# Patient Record
Sex: Female | Born: 1965 | Race: White | Hispanic: No | Marital: Married | Smoking: Never smoker
Health system: Southern US, Community
[De-identification: ages and names within clinical notes are randomized; demographics above are authoritative.]

## PROBLEM LIST (undated history)

## (undated) DIAGNOSIS — C801 Malignant (primary) neoplasm, unspecified: Secondary | ICD-10-CM

## (undated) DIAGNOSIS — Z87442 Personal history of urinary calculi: Secondary | ICD-10-CM

## (undated) DIAGNOSIS — E785 Hyperlipidemia, unspecified: Secondary | ICD-10-CM

## (undated) DIAGNOSIS — E119 Type 2 diabetes mellitus without complications: Secondary | ICD-10-CM

## (undated) DIAGNOSIS — N200 Calculus of kidney: Secondary | ICD-10-CM

## (undated) HISTORY — PX: LAPAROSCOPIC ENDOMETRIOSIS FULGURATION: SUR769

## (undated) HISTORY — PX: ABDOMINAL HYSTERECTOMY: SHX81

---

## 2014-08-15 ENCOUNTER — Ambulatory Visit: Payer: Self-pay | Admitting: Internal Medicine

## 2015-01-24 ENCOUNTER — Other Ambulatory Visit: Payer: Self-pay | Admitting: Orthopedic Surgery

## 2015-01-24 DIAGNOSIS — M2342 Loose body in knee, left knee: Secondary | ICD-10-CM

## 2015-01-24 DIAGNOSIS — M2341 Loose body in knee, right knee: Secondary | ICD-10-CM

## 2015-01-31 ENCOUNTER — Ambulatory Visit
Admission: RE | Admit: 2015-01-31 | Discharge: 2015-01-31 | Disposition: A | Discharge status: Home / Self Care | Payer: Managed Care, Other (non HMO) | Source: Ambulatory Visit | Attending: Orthopedic Surgery | Admitting: Orthopedic Surgery

## 2015-01-31 DIAGNOSIS — M7122 Synovial cyst of popliteal space [Baker], left knee: Secondary | ICD-10-CM | POA: Insufficient documentation

## 2015-01-31 DIAGNOSIS — M2342 Loose body in knee, left knee: Secondary | ICD-10-CM | POA: Insufficient documentation

## 2015-01-31 DIAGNOSIS — M2242 Chondromalacia patellae, left knee: Secondary | ICD-10-CM | POA: Insufficient documentation

## 2015-01-31 DIAGNOSIS — M2341 Loose body in knee, right knee: Secondary | ICD-10-CM | POA: Diagnosis present

## 2015-01-31 DIAGNOSIS — M67461 Ganglion, right knee: Secondary | ICD-10-CM | POA: Diagnosis not present

## 2015-01-31 DIAGNOSIS — M179 Osteoarthritis of knee, unspecified: Secondary | ICD-10-CM | POA: Insufficient documentation

## 2017-09-07 ENCOUNTER — Encounter: Payer: Self-pay | Admitting: Emergency Medicine

## 2017-09-07 ENCOUNTER — Emergency Department: Payer: Commercial Managed Care - PPO

## 2017-09-07 ENCOUNTER — Inpatient Hospital Stay
Admission: EM | Admit: 2017-09-07 | Discharge: 2017-09-08 | DRG: 694 | Disposition: A | Discharge status: Home / Self Care | Payer: Commercial Managed Care - PPO | Attending: Urology | Admitting: Urology

## 2017-09-07 DIAGNOSIS — N132 Hydronephrosis with renal and ureteral calculous obstruction: Secondary | ICD-10-CM | POA: Diagnosis not present

## 2017-09-07 DIAGNOSIS — Z90710 Acquired absence of both cervix and uterus: Secondary | ICD-10-CM

## 2017-09-07 DIAGNOSIS — Z8249 Family history of ischemic heart disease and other diseases of the circulatory system: Secondary | ICD-10-CM

## 2017-09-07 DIAGNOSIS — R109 Unspecified abdominal pain: Secondary | ICD-10-CM

## 2017-09-07 DIAGNOSIS — Z833 Family history of diabetes mellitus: Secondary | ICD-10-CM

## 2017-09-07 DIAGNOSIS — Z800 Family history of malignant neoplasm of digestive organs: Secondary | ICD-10-CM

## 2017-09-07 DIAGNOSIS — Z6841 Body Mass Index (BMI) 40.0 and over, adult: Secondary | ICD-10-CM

## 2017-09-07 DIAGNOSIS — Z803 Family history of malignant neoplasm of breast: Secondary | ICD-10-CM

## 2017-09-07 DIAGNOSIS — Z91048 Other nonmedicinal substance allergy status: Secondary | ICD-10-CM

## 2017-09-07 DIAGNOSIS — R112 Nausea with vomiting, unspecified: Secondary | ICD-10-CM

## 2017-09-07 DIAGNOSIS — Z85820 Personal history of malignant melanoma of skin: Secondary | ICD-10-CM

## 2017-09-07 DIAGNOSIS — I10 Essential (primary) hypertension: Secondary | ICD-10-CM | POA: Diagnosis present

## 2017-09-07 DIAGNOSIS — E119 Type 2 diabetes mellitus without complications: Secondary | ICD-10-CM | POA: Diagnosis present

## 2017-09-07 DIAGNOSIS — N200 Calculus of kidney: Secondary | ICD-10-CM

## 2017-09-07 HISTORY — DX: Hyperlipidemia, unspecified: E78.5

## 2017-09-07 HISTORY — DX: Calculus of kidney: N20.0

## 2017-09-07 LAB — CBC
HCT: 44.9 % (ref 35.0–47.0)
HEMOGLOBIN: 15.1 g/dL (ref 12.0–16.0)
MCH: 29.3 pg (ref 26.0–34.0)
MCHC: 33.7 g/dL (ref 32.0–36.0)
MCV: 87.1 fL (ref 80.0–100.0)
PLATELETS: 210 10*3/uL (ref 150–440)
RBC: 5.15 MIL/uL (ref 3.80–5.20)
RDW: 13.4 % (ref 11.5–14.5)
WBC: 7.2 10*3/uL (ref 3.6–11.0)

## 2017-09-07 LAB — BLOOD GAS, VENOUS
Acid-base deficit: 5.8 mmol/L — ABNORMAL HIGH (ref 0.0–2.0)
BICARBONATE: 19.5 mmol/L — AB (ref 20.0–28.0)
O2 SAT: 91.8 %
PCO2 VEN: 37 mmHg — AB (ref 44.0–60.0)
PO2 VEN: 68 mmHg — AB (ref 32.0–45.0)
Patient temperature: 37
pH, Ven: 7.33 (ref 7.250–7.430)

## 2017-09-07 LAB — BASIC METABOLIC PANEL
ANION GAP: 12 (ref 5–15)
ANION GAP: 14 (ref 5–15)
BUN: 15 mg/dL (ref 6–20)
BUN: 15 mg/dL (ref 6–20)
CO2: 19 mmol/L — ABNORMAL LOW (ref 22–32)
CO2: 19 mmol/L — AB (ref 22–32)
CREATININE: 0.81 mg/dL (ref 0.44–1.00)
Calcium: 9.3 mg/dL (ref 8.9–10.3)
Calcium: 8.9 mg/dL (ref 8.9–10.3)
Chloride: 107 mmol/L (ref 101–111)
Chloride: 109 mmol/L (ref 101–111)
Creatinine, Ser: 0.87 mg/dL (ref 0.44–1.00)
GFR calc Af Amer: >60 mL/min (ref >60)
GLUCOSE: 303 mg/dL — AB (ref 65–99)
Glucose, Bld: 288 mg/dL — ABNORMAL HIGH (ref 65–99)
POTASSIUM: 4 mmol/L (ref 3.5–5.1)
Potassium: 3.9 mmol/L (ref 3.5–5.1)
SODIUM: 138 mmol/L (ref 135–145)
Sodium: 142 mmol/L (ref 135–145)

## 2017-09-07 LAB — URINALYSIS, COMPLETE (UACMP) WITH MICROSCOPIC
Bilirubin Urine: NEGATIVE
Glucose, UA: >=500 mg/dL — AB
Ketones, ur: 5 mg/dL — AB
Leukocytes, UA: NEGATIVE
NITRITE: NEGATIVE
PH: 5 (ref 5.0–8.0)
PROTEIN: 30 mg/dL — AB
Specific Gravity, Urine: 1.029 (ref 1.005–1.030)

## 2017-09-07 LAB — HEPATIC FUNCTION PANEL
ALBUMIN: 3.9 g/dL (ref 3.5–5.0)
ALK PHOS: 87 U/L (ref 38–126)
ALT: 32 U/L (ref 14–54)
AST: 32 U/L (ref 15–41)
Bilirubin, Direct: <0.1 mg/dL — ABNORMAL LOW (ref 0.1–0.5)
Total Bilirubin: 0.7 mg/dL (ref 0.3–1.2)
Total Protein: 7 g/dL (ref 6.5–8.1)

## 2017-09-07 LAB — GLUCOSE, CAPILLARY: Glucose-Capillary: 301 mg/dL — ABNORMAL HIGH (ref 65–99)

## 2017-09-07 LAB — LIPASE, BLOOD: Lipase: 37 U/L (ref 11–51)

## 2017-09-07 MED ORDER — FENTANYL CITRATE (PF) 100 MCG/2ML IJ SOLN
50.0000 ug | INTRAMUSCULAR | Status: DC | PRN
  Administered 2017-09-07: 50 ug via INTRAVENOUS
  Filled 2017-09-07: qty 2

## 2017-09-07 MED ORDER — INSULIN ASPART 100 UNIT/ML ~~LOC~~ SOLN
0.0000 [IU] | Freq: Every day | SUBCUTANEOUS | Status: DC
  Administered 2017-09-07: 4 [IU] via SUBCUTANEOUS
  Filled 2017-09-07: qty 1

## 2017-09-07 MED ORDER — MORPHINE SULFATE (PF) 4 MG/ML IV SOLN
4.0000 mg | Freq: Once | INTRAVENOUS | Status: AC
  Administered 2017-09-07: 4 mg via INTRAVENOUS
  Filled 2017-09-07: qty 1

## 2017-09-07 MED ORDER — ONDANSETRON HCL 4 MG/2ML IJ SOLN
4.0000 mg | Freq: Once | INTRAMUSCULAR | Status: AC
  Administered 2017-09-07: 4 mg via INTRAVENOUS
  Filled 2017-09-07: qty 2

## 2017-09-07 MED ORDER — HYDROMORPHONE HCL 1 MG/ML IJ SOLN: 1.0000 mg | INTRAMUSCULAR | Status: DC | PRN

## 2017-09-07 MED ORDER — METOPROLOL TARTRATE 25 MG PO TABS: 25.0000 mg | ORAL_TABLET | Freq: Two times a day (BID) | ORAL | Status: DC

## 2017-09-07 MED ORDER — PROMETHAZINE HCL 25 MG/ML IJ SOLN
12.5000 mg | Freq: Once | INTRAMUSCULAR | Status: AC
  Administered 2017-09-07: 12.5 mg via INTRAMUSCULAR
  Filled 2017-09-07: qty 1

## 2017-09-07 MED ORDER — SODIUM CHLORIDE 0.9 % IV BOLUS (SEPSIS)
500.0000 mL | Freq: Once | INTRAVENOUS | Status: AC
  Administered 2017-09-07: 500 mL via INTRAVENOUS

## 2017-09-07 MED ORDER — HYDROMORPHONE HCL 1 MG/ML IJ SOLN
1.5000 mg | INTRAMUSCULAR | Status: AC
  Administered 2017-09-07: 1.5 mg via INTRAVENOUS
  Filled 2017-09-07: qty 2

## 2017-09-07 MED ORDER — SODIUM CHLORIDE 0.9 % IV SOLN
INTRAVENOUS | Status: DC
  Administered 2017-09-07 – 2017-09-08 (×3): via INTRAVENOUS

## 2017-09-07 MED ORDER — INSULIN ASPART 100 UNIT/ML ~~LOC~~ SOLN
0.0000 [IU] | Freq: Three times a day (TID) | SUBCUTANEOUS | Status: DC
  Administered 2017-09-08: 2 [IU] via SUBCUTANEOUS
  Administered 2017-09-08: 1 [IU] via SUBCUTANEOUS
  Filled 2017-09-07 (×2): qty 1

## 2017-09-07 MED ORDER — PROMETHAZINE HCL 25 MG/ML IJ SOLN
25.0000 mg | Freq: Four times a day (QID) | INTRAMUSCULAR | Status: DC | PRN
  Filled 2017-09-07: qty 1

## 2017-09-07 NOTE — ED Notes (Signed)
Dr [facility] paged at home due to no "Admit to Inpatient" order being place prior to leaving ED post evaluating patient. "Admit to Inpatient" order entered by this RN as a Verbal with Readback Cosign required order. All information filled out with Dr Yves Dill on the phone and verified.

## 2017-09-07 NOTE — Consult Note (Signed)
[place] at [place] NAME: Braiden Presutti    MR#:  [id number]  DATE OF BIRTH:  [birth year]  DATE OF ADMISSION:  09/07/2017  PRIMARY CARE PHYSICIAN: Idelle Crouch, MD   REQUESTING/REFERRING PHYSICIAN: Mare Ferrari MD  CHIEF COMPLAINT:   Chief Complaint  Patient presents with  . Flank Pain    HISTORY OF PRESENT ILLNESS:  Grace Flores  is a 52 y.o. female with a known history of kidney stones being admitted for kidney stone likely needing lithotripsy tomorrow. She c/o n/v and flank pain. We are consulted for medical mgmt mainly diabetes. PAST MEDICAL HISTORY:   Past Medical History:  Diagnosis Date  . Hyperlipidemia   . Kidney stones   . Cervicalgia  . Eczema, unspecified  . History of endometriosis  stage IV, S/P total hysterectomy  . History of onychomycosis  toenails  . Melanoma (CMS-HCC)  Superficial, left shoulder s/p right excision  . Melanoma in situ of shoulder (CMS-HCC) 03/2011  left  . Metatarsalgia of left foot  likely Morton's neuroma  . Migraine  . Obesity, unspecified  . Psoriasis, unspecified  . Psoriasis, unspecified  . Seasonal allergies  PAST SURGICAL HISTOIRY:   Past Surgical History:  Procedure Laterality Date  . ABDOMINAL HYSTERECTOMY    . LAPAROSCOPIC ENDOMETRIOSIS FULGURATION     SOCIAL HISTORY:   Social History   Tobacco Use  . Smoking status: Never Smoker  . Smokeless tobacco: Never Used  Substance Use Topics  . Alcohol use: Yes    FAMILY HISTORY:  No family history on file. . Asthma Mother  . Diabetes type II Mother  . Heart attack Mother  . Hypertension Mother  . Diabetes type II Father  . Breast cancer Maternal Aunt  . Colon cancer Other  . Stroke Other  DRUG ALLERGIES:  No Known Allergies  REVIEW OF SYSTEMS:  CONSTITUTIONAL: No fever, fatigue or weakness.  EYES: No blurred or double vision.  EARS, NOSE, AND THROAT: No tinnitus or ear pain.  RESPIRATORY: No  cough, shortness of breath, wheezing or hemoptysis.  CARDIOVASCULAR: No chest pain, orthopnea, edema.  GASTROINTESTINAL: + nausea, vomiting, No diarrhea or abdominal pain.  GENITOURINARY: No dysuria, hematuria. + flank pain ENDOCRINE: No polyuria, nocturia,  HEMATOLOGY: No anemia, easy bruising or bleeding SKIN: No rash or lesion. MUSCULOSKELETAL: No joint pain or arthritis.   NEUROLOGIC: No tingling, numbness, weakness.  PSYCHIATRY: No anxiety or depression.  MEDICATIONS AT HOME:   Prior to Admission medications   Medication Sig Start Date End Date Taking? Authorizing Provider  amoxicillin-clavulanate (AUGMENTIN) 875-125 MG tablet Take 1 tablet by mouth every 12 (twelve) hours. 09/02/17  Yes [provider]  atorvastatin (LIPITOR) 10 MG tablet Take 1 tablet by mouth daily. 07/18/17  Yes [provider]  tamsulosin (FLOMAX) 0.4 MG CAPS capsule Take 1 capsule by mouth daily. 09/01/17  Yes [provider]  ondansetron (ZOFRAN) 4 MG tablet Take 1 tablet by mouth every 12 (twelve) hours. 08/31/17   [provider]    VITAL SIGNS:  Blood pressure (!) 167/97, pulse 91, temperature 97.7 F (36.5 C), temperature source Oral, resp. rate 20, height 5\' 6"  (1.676 m), weight (!) 145.2 kg (320 lb), SpO2 98 %. PHYSICAL EXAMINATION:  GENERAL:  52 y.o.-year-old patient lying in the bed with no acute distress.  EYES: Pupils equal, round, reactive to light and accommodation. No scleral icterus. Extraocular muscles intact.  HEENT: Head atraumatic, normocephalic. Oropharynx and nasopharynx clear.  NECK:  Supple, no jugular venous distention. No thyroid enlargement, no tenderness.  LUNGS: Normal breath sounds bilaterally, no wheezing, rales,rhonchi or crepitation. No use of accessory muscles of respiration.  CARDIOVASCULAR: S1, S2 normal. No murmurs, rubs, or gallops.  ABDOMEN: Soft, nontender, nondistended. Bowel sounds present. No organomegaly or mass.  EXTREMITIES: No pedal  edema, cyanosis, or clubbing.  NEUROLOGIC: Cranial nerves II through XII are intact. Muscle strength 5/5 in all extremities. Sensation intact. Gait not checked.  PSYCHIATRIC: The patient is alert and oriented x 3.  SKIN: No obvious rash, lesion, or ulcer.  LABORATORY PANEL:   CBC Recent Labs  Lab 09/07/17 1541  WBC 7.2  HGB 15.1  HCT 44.9  PLT 210   ------------------------------------------------------------------------------------------------------------------  Chemistries  Recent Labs  Lab 09/07/17 1541  NA 138  K 3.9  CL 107  CO2 19*  GLUCOSE 288*  BUN 15  CREATININE 0.81  CALCIUM 9.3  AST 32  ALT 32  ALKPHOS 87  BILITOT 0.7   ------------------------------------------------------------------------------------------------------------------  Cardiac Enzymes No results for input(s): TROPONINI in the last 168 hours. ------------------------------------------------------------------------------------------------------------------  RADIOLOGY:  Ct Renal Stone Study  Result Date: 09/07/2017 CLINICAL DATA:  Kidney stones. EXAM: CT ABDOMEN AND PELVIS WITHOUT CONTRAST TECHNIQUE: Multidetector CT imaging of the abdomen and pelvis was performed following the standard protocol without IV contrast. COMPARISON:  None. FINDINGS: Lower chest: Lung bases are clear. Hepatobiliary: No focal hepatic lesion. No biliary duct dilatation. Gallbladder is normal. Common bile duct is normal. Pancreas: Pancreas is normal. No ductal dilatation. No pancreatic inflammation. Spleen: Normal spleen Adrenals/urinary tract: Adrenal glands normal. Obstructing calculus in the mid LEFT ureter measures 3 mm (image 51, series 2. Mild hydroureter and hydronephrosis on the LEFT. This calculus is at the L4-L3 vertebral body level. There are 3 additional calculi within LEFT kidney ranging size from 2-7 mm. No RIGHT nephrolithiasis or ureterolithiasis.  No bladder calculi. Stomach/Bowel: Stomach, small bowel,  appendix, and cecum are normal. The colon and rectosigmoid colon are normal. Vascular/Lymphatic: Abdominal aorta is normal caliber. No periportal or retroperitoneal adenopathy. No pelvic adenopathy. Reproductive: Post hysterectomy anatomy Other: No free fluid. Musculoskeletal: No aggressive osseous lesion. IMPRESSION: 1. Obstructing calculus in the mid LEFT ureter. Mild to moderate moderate obstructive uropathy on the LEFT. 2. LEFT nephrolithiasis. Electronically Signed   By: Suzy Bouchard M.D.   On: 09/07/2017 19:52   EKG:  No orders found for this or any previous visit.  IMPRESSION AND PLAN:  17 y f admitted for kidney stone and we r consulted for medical mgmt  * DM - ssi for now - check Hba1c - DM nurse c/s  * Renal stones - mgmt per Urology - likely procedure planned for tomorrow - medically clear for planned procedure  * HTN - start metoprolol  * N/V - likely due to renal stones - symptomatic mgmt   All the records are reviewed and case discussed with Consulting provider. Management plans discussed with the patient, family (husband at bedside), Dr Rogers Blocker and they are in agreement.  CODE STATUS: full code  TOTAL TIME TAKING CARE OF THIS PATIENT: 25 minutes.    Max Sane M.D on 09/07/2017 at 9:41 PM  Between 7am to 6pm - Pager - [phone]  After 6pm go to [email or web address] - Proofreader  [facility]ospital[facility]  Office  [phone]  CC: Primary care Physician: Idelle Crouch, MD   Note: This dictation was prepared with Dragon dictation along with smaller phrase technology. Any transcriptional errors  that result from this process are unintentional.

## 2017-09-07 NOTE — ED Provider Notes (Signed)
[facility] Emergency Department Provider Note   ____________________________________________   First MD Initiated Contact with Patient 09/07/17 1844     (approximate)  I have reviewed the triage vital signs and the nursing notes.   HISTORY  Chief Complaint Flank Pain  EM caveat: Some limitation due to severity of pain.  Patient very uncomfortable, unable to provide a full history due to severe discomfort.  HPI Grace Flores is a 52 y.o. female reports she is having severe pain in her left t lower back and a severe urge to urinate.  She had a "kidney stone" that they think she passed about a week ago, she now reports that this afternoon at work she had sudden severe pain in her left lower back and pelvis, severe urge to urinate, darkening of her urine, severe nausea and has vomited a few times.  She is on antibiotic for possible urinary tract infection, this seems to have gone away, but now she is having severe pain once again and think she is having a kidney stone.  She had previous kidney stones, none of required surgical removal.  No fevers or chills.  No chest pain or trouble breathing.  Reports the pain is severe 10 out of 10 some of the worst of her life and potentially worse than having children.  She is previously had a hysterectomy   Past Medical History:  Diagnosis Date  . Hyperlipidemia   . Kidney stones     There are no active problems to display for this patient.   Past Surgical History:  Procedure Laterality Date  . ABDOMINAL HYSTERECTOMY    . LAPAROSCOPIC ENDOMETRIOSIS FULGURATION      Prior to Admission medications   Medication Sig Start Date End Date Taking? Authorizing Provider  amoxicillin-clavulanate (AUGMENTIN) 875-125 MG tablet Take 1 tablet by mouth every 12 (twelve) hours. 09/02/17  Yes [provider]  atorvastatin (LIPITOR) 10 MG tablet Take 1 tablet by mouth daily. 07/18/17  Yes [provider]    tamsulosin (FLOMAX) 0.4 MG CAPS capsule Take 1 capsule by mouth daily. 09/01/17  Yes [provider]  ondansetron (ZOFRAN) 4 MG tablet Take 1 tablet by mouth every 12 (twelve) hours. 08/31/17   [provider]    Allergies Patient has no known allergies.  No family history on file.  Social History Social History   Tobacco Use  . Smoking status: Never Smoker  . Smokeless tobacco: Never Used  Substance Use Topics  . Alcohol use: Yes  . Drug use: No    Review of Systems Constitutional: No fever/chills Eyes: No visual changes. ENT: No sore throat. Cardiovascular: Denies chest pain. Respiratory: Denies shortness of breath. Gastrointestinal:   No diarrhea.  No constipation. Genitourinary: Negative for dysuria.  Reports darkening of the urine.  Possibly some blood in her urine a week ago. Musculoskeletal: Negative for back pain septic her right lower back. Skin: Negative for rash. Neurological: Negative for headaches, focal weakness or numbness.    ____________________________________________   PHYSICAL EXAM:  VITAL SIGNS: ED Triage Vitals [09/07/17 1538]  Enc Vitals Group     BP (!) 167/97     Pulse Rate 91     Resp 20     Temp 97.7 F (36.5 C)     Temp Source Oral     SpO2 98 %     Weight (!) 320 lb (145.2 kg)     Height 5\' 6"  (1.676 m)     Head  Circumference      Peak Flow      Pain Score 10     Pain Loc      Pain Edu?      Excl. in [place]?     Constitutional: Alert and oriented.  Standing up, bending forward, actively vomiting.  Appears in severe pain.  Standing upright. Eyes: Conjunctivae are normal. Head: Atraumatic. Nose: No congestion/rhinnorhea. Mouth/Throat: Mucous membranes are moist. Neck: No stridor.   Cardiovascular: Normal rate, regular rhythm. Grossly normal heart sounds.  Good peripheral circulation. Respiratory: Normal respiratory effort.  No retractions. Lungs CTAB. Gastrointestinal: Soft and nontender.  Left lower flank  discomfort. Musculoskeletal: No lower extremity tenderness nor edema. Neurologic:  Normal speech and language.   Skin:  Skin is warm, dry and intact. No rash noted. Psychiatric: Mood and affect are anxious. Speech and behavior are normal.  ____________________________________________   LABS (all labs ordered are listed, but only abnormal results are displayed)  Labs Reviewed  URINALYSIS, COMPLETE (UACMP) WITH MICROSCOPIC - Abnormal; Notable for the following components:      Result Value   Color, Urine YELLOW (*)    APPearance HAZY (*)    Glucose, UA >=500 (*)    Hgb urine dipstick LARGE (*)    Ketones, ur 5 (*)    Protein, ur 30 (*)    Bacteria, UA RARE (*)    Squamous Epithelial / LPF 6-30 (*)    All other components within normal limits  BASIC METABOLIC PANEL - Abnormal; Notable for the following components:   CO2 19 (*)    Glucose, Bld 288 (*)    All other components within normal limits  HEPATIC FUNCTION PANEL - Abnormal; Notable for the following components:   Bilirubin, Direct <0.1 (*)    All other components within normal limits  URINE CULTURE  CBC  LIPASE, BLOOD  BASIC METABOLIC PANEL  BLOOD GAS, VENOUS  HEMOGLOBIN A1C   ____________________________________________  EKG   ____________________________________________  RADIOLOGY  see Ct scan report, reviewed by me, left-sided hydronephrosis and kidney stone ____________________________________________   PROCEDURES  Procedure(s) performed: None  Procedures  Critical Care performed: No  ____________________________________________   INITIAL IMPRESSION / ASSESSMENT AND PLAN / ED COURSE  Pertinent labs & imaging results that were available during my care of the patient were reviewed by me and considered in my medical decision making (see chart for details).  Differential diagnosis includes but is not limited to, abdominal perforation, aortic dissection, cholecystitis, appendicitis, diverticulitis,  colitis, esophagitis/gastritis, kidney stone, pyelonephritis, urinary tract infection, aortic aneurysm. All are considered in decision and treatment plan. Based upon the patient's presentation and risk factors, highly suspicious for stone disease.  No evidence of superinfection.  CT demonstrates a left-sided kidney stone, relatively small but patient having very much intractable nausea, pain improving, but intractable nausea despite antiemetics and hydration.  In addition elevated blood glucose suggestive of possible new onset diabetes as she reports she is currently trying to diet control.  She does also have some ketones in her urine, elevated glucose and a low CO2.  Though I think her CO2 is likely low due to the vomiting she is experienced, I have ordered a VBG and a medicine consult.  I do not think clinically she is in DKA, but will order VBG, continue fluid hydration, patient will be admitted to Dr. Eliberto Ivory of urology due to the intractability of her discomfort and nausea and vomiting.  He has seen her in the ER, admitting the patient,  medicine performing consult regarding diabetes management.  Patient and her husband agreeable with plan for admission.       ____________________________________________   FINAL CLINICAL IMPRESSION(S) / ED DIAGNOSES  Final diagnoses:  Left flank pain  Kidney stone on left side  Intractable vomiting with nausea, unspecified vomiting type      NEW MEDICATIONS STARTED DURING THIS VISIT:  New Prescriptions   No medications on file     Note:  This document was prepared using Dragon voice recognition software and may include unintentional dictation errors.     Delman Kitten, MD 09/07/17 2149

## 2017-09-07 NOTE — ED Notes (Signed)
This RN assuming responsibility of care at this time

## 2017-09-07 NOTE — ED Notes (Signed)
Patient transported to us 

## 2017-09-07 NOTE — ED Notes (Signed)
Dr [facility] paged at home due to no "Admit to Inpatient" order being place prior to leaving ED post evaluating patient. "Admit to Inpatient" order entered by this RN as a Verbal with Readback Cosign required order. All information filled out with Dr Yves Dill on the phone and verified. Per Dr Randalyn Rhea any error in order entry will be corrected in the morning by him.

## 2017-09-07 NOTE — ED Triage Notes (Signed)
Pt comes into the ED via POV c/o left flank pain.  Patient diagnosed with kidney stones last week.  Patient states she passed it and didn't realize there was another one.  They also have her on antibiotics due to a related kidney infection.  Patient has nausea and vomiting and is unable to sit still in the wheelchair at this time.  Patient has even and unlabored respirations.

## 2017-09-07 NOTE — H&P (Signed)
Urology Admission H&P  Chief Complaint: Left flank pain, nausea and vomiting  History of Present Illness: Was the patient developed left flank pain associate with nausea and was seen by her primary care physician last week and prescribed Augmentin.  The nausea, vomiting and flank pain recurred today prompting emergency room visit.  CT scan revealed a 3 mm stone in the left mid ureter.  Patient also had several other stones in the left kidney with the largest measuring 7 mm in size.  Patient denies fever chills or gross hematuria.  She passed a kidney stone 10 years ago but has never had instrumentation for stone disease. Past Medical History:  Diagnosis Date  . Hyperlipidemia   . Kidney stones   GERD Past Surgical History:  Procedure Laterality Date  . ABDOMINAL HYSTERECTOMY    . LAPAROSCOPIC ENDOMETRIOSIS FULGURATION      Home Medications:  Current Facility-Administered Medications  Medication Dose Route Frequency Provider Last Rate Last Dose  . fentaNYL (SUBLIMAZE) injection 50 mcg  50 mcg Intravenous Q30 min PRN Delman Kitten, MD   50 mcg at 09/07/17 1547  . insulin aspart (novoLOG) injection 0-5 Units  0-5 Units Subcutaneous QHS Max Sane, MD      . Derrill Memo ON 09/08/2017] insulin aspart (novoLOG) injection 0-9 Units  0-9 Units Subcutaneous TID WC Max Sane, MD       Current Outpatient Medications  Medication Sig Dispense Refill Last Dose  . amoxicillin-clavulanate (AUGMENTIN) 875-125 MG tablet Take 1 tablet by mouth every 12 (twelve) hours.  0 09/07/2017 at Unknown time  . atorvastatin (LIPITOR) 10 MG tablet Take 1 tablet by mouth daily.   09/07/2017 at Unknown time  . tamsulosin (FLOMAX) 0.4 MG CAPS capsule Take 1 capsule by mouth daily.  0 09/07/2017 at Unknown time  . ondansetron (ZOFRAN) 4 MG tablet Take 1 tablet by mouth every 12 (twelve) hours.  0 prn at prn   Allergies: No Known Allergies  No family history on file. Social History:  reports that  has never smoked. she has  never used smokeless tobacco. She reports that she drinks alcohol. She reports that she does not use drugs.  Review of Systems  Constitutional: Negative.   HENT: Negative.   Eyes: Negative.   Respiratory: Negative.   Cardiovascular: Negative.   Gastrointestinal: Positive for abdominal pain, heartburn, nausea and vomiting.  Genitourinary: Positive for flank pain and hematuria.  Musculoskeletal: Positive for back pain.  Skin: Negative.   Neurological: Negative.   Endo/Heme/Allergies: Negative.   Psychiatric/Behavioral: Negative.   All other systems reviewed and are negative.   Physical Exam:  Vital signs in last 24 hours: Temp:  [97.7 F (36.5 C)] 97.7 F (36.5 C) (03/13 1538) Pulse Rate:  [91] 91 (03/13 1538) Resp:  [20] 20 (03/13 1538) BP: (167)/(97) 167/97 (03/13 1538) SpO2:  [98 %] 98 % (03/13 1538) Weight:  [145.2 kg (320 lb)] 145.2 kg (320 lb) (03/13 1538) Physical Exam  Constitutional: She is oriented to person, place, and time. She appears well-nourished.  HENT:  Head: Normocephalic.  Eyes: Pupils are equal, round, and reactive to light.  Neck: Normal range of motion.  Cardiovascular: Normal rate.  Respiratory: Effort normal.  GI: Soft.  Musculoskeletal: Normal range of motion.  Neurological: She is alert and oriented to person, place, and time.  Psychiatric: Judgment normal.    Laboratory Data:  Results for orders placed or performed during the hospital encounter of 09/07/17 (from the past 24 hour(s))  Urinalysis, Complete w Microscopic  Status: Abnormal   Collection Time: 09/07/17  3:41 PM  Result Value Ref Range   Color, Urine YELLOW (A) YELLOW   APPearance HAZY (A) CLEAR   Specific Gravity, Urine 1.029 1.005 - 1.030   pH 5.0 5.0 - 8.0   Glucose, UA >=500 (A) NEGATIVE mg/dL   Hgb urine dipstick LARGE (A) NEGATIVE   Bilirubin Urine NEGATIVE NEGATIVE   Ketones, ur 5 (A) NEGATIVE mg/dL   Protein, ur 30 (A) NEGATIVE mg/dL   Nitrite NEGATIVE NEGATIVE    Leukocytes, UA NEGATIVE NEGATIVE   RBC / HPF 6-30 0 - 5 RBC/hpf   WBC, UA 6-30 0 - 5 WBC/hpf   Bacteria, UA RARE (A) NONE SEEN   Squamous Epithelial / LPF 6-30 (A) NONE SEEN   Mucus PRESENT   Basic metabolic panel     Status: Abnormal   Collection Time: 09/07/17  3:41 PM  Result Value Ref Range   Sodium 138 135 - 145 mmol/L   Potassium 3.9 3.5 - 5.1 mmol/L   Chloride 107 101 - 111 mmol/L   CO2 19 (L) 22 - 32 mmol/L   Glucose, Bld 288 (H) 65 - 99 mg/dL   BUN 15 6 - 20 mg/dL   Creatinine, Ser 0.81 0.44 - 1.00 mg/dL   Calcium 9.3 8.9 - 10.3 mg/dL   GFR calc non Af Amer >60 >60 mL/min   GFR calc Af Amer >60 >60 mL/min   Anion gap 12 5 - 15  CBC     Status: None   Collection Time: 09/07/17  3:41 PM  Result Value Ref Range   WBC 7.2 3.6 - 11.0 K/uL   RBC 5.15 3.80 - 5.20 MIL/uL   Hemoglobin 15.1 12.0 - 16.0 g/dL   HCT 44.9 35.0 - 47.0 %   MCV 87.1 80.0 - 100.0 fL   MCH 29.3 26.0 - 34.0 pg   MCHC 33.7 32.0 - 36.0 g/dL   RDW 13.4 11.5 - 14.5 %   Platelets 210 150 - 440 K/uL  Hepatic function panel     Status: Abnormal   Collection Time: 09/07/17  3:41 PM  Result Value Ref Range   Total Protein 7.0 6.5 - 8.1 g/dL   Albumin 3.9 3.5 - 5.0 g/dL   AST 32 15 - 41 U/L   ALT 32 14 - 54 U/L   Alkaline Phosphatase 87 38 - 126 U/L   Total Bilirubin 0.7 0.3 - 1.2 mg/dL   Bilirubin, Direct <0.1 (L) 0.1 - 0.5 mg/dL   Indirect Bilirubin NOT CALCULATED 0.3 - 0.9 mg/dL  Lipase, blood     Status: None   Collection Time: 09/07/17  3:41 PM  Result Value Ref Range   Lipase 37 11 - 51 U/L   No results found for this or any previous visit (from the past 240 hour(s)). Creatinine: Recent Labs    09/07/17 1541  CREATININE 0.81   Baseline Creatinine: 0.81  Impression/Assessment:  1.  Left ureterolithiasis with hydronephrosis 2.  Renal colic 3.  Nausea and vomiting 4.  Probable diabetes mellitus  Plan:  1.  IV hydration, analgesia and antiemetics 2.  Internal medicine l consultation for  assistance with diabetes 3.  Seed with ureteroscopy, laser lithotripsy and stent placement tomorrow if she fails to pass the stone.  [name] 09/07/2017, 9:36 PM

## 2017-09-08 ENCOUNTER — Other Ambulatory Visit: Payer: Self-pay

## 2017-09-08 ENCOUNTER — Encounter
Admission: EM | Disposition: A | Discharge status: Home / Self Care | Payer: Self-pay | Source: Home / Self Care | Attending: Urology

## 2017-09-08 ENCOUNTER — Encounter: Payer: Self-pay | Admitting: Registered Nurse

## 2017-09-08 DIAGNOSIS — Z6841 Body Mass Index (BMI) 40.0 and over, adult: Secondary | ICD-10-CM | POA: Diagnosis not present

## 2017-09-08 DIAGNOSIS — Z803 Family history of malignant neoplasm of breast: Secondary | ICD-10-CM | POA: Diagnosis not present

## 2017-09-08 DIAGNOSIS — Z85820 Personal history of malignant melanoma of skin: Secondary | ICD-10-CM | POA: Diagnosis not present

## 2017-09-08 DIAGNOSIS — I10 Essential (primary) hypertension: Secondary | ICD-10-CM | POA: Diagnosis present

## 2017-09-08 DIAGNOSIS — Z90710 Acquired absence of both cervix and uterus: Secondary | ICD-10-CM | POA: Diagnosis not present

## 2017-09-08 DIAGNOSIS — R109 Unspecified abdominal pain: Secondary | ICD-10-CM | POA: Diagnosis present

## 2017-09-08 DIAGNOSIS — Z8249 Family history of ischemic heart disease and other diseases of the circulatory system: Secondary | ICD-10-CM | POA: Diagnosis not present

## 2017-09-08 DIAGNOSIS — Z833 Family history of diabetes mellitus: Secondary | ICD-10-CM | POA: Diagnosis not present

## 2017-09-08 DIAGNOSIS — Z91048 Other nonmedicinal substance allergy status: Secondary | ICD-10-CM | POA: Diagnosis not present

## 2017-09-08 DIAGNOSIS — N200 Calculus of kidney: Secondary | ICD-10-CM

## 2017-09-08 DIAGNOSIS — N132 Hydronephrosis with renal and ureteral calculous obstruction: Secondary | ICD-10-CM | POA: Diagnosis present

## 2017-09-08 DIAGNOSIS — E119 Type 2 diabetes mellitus without complications: Secondary | ICD-10-CM | POA: Diagnosis present

## 2017-09-08 DIAGNOSIS — Z800 Family history of malignant neoplasm of digestive organs: Secondary | ICD-10-CM | POA: Diagnosis not present

## 2017-09-08 LAB — GLUCOSE, CAPILLARY
GLUCOSE-CAPILLARY: 148 mg/dL — AB (ref 65–99)
Glucose-Capillary: 170 mg/dL — ABNORMAL HIGH (ref 65–99)

## 2017-09-08 LAB — HEMOGLOBIN A1C
Hgb A1c MFr Bld: 8.5 % — ABNORMAL HIGH (ref 4.8–5.6)
Mean Plasma Glucose: 197.25 mg/dL

## 2017-09-08 LAB — MRSA PCR SCREENING: MRSA BY PCR: POSITIVE — AB

## 2017-09-08 SURGERY — CYSTOSCOPY/URETEROSCOPY/HOLMIUM LASER/STENT PLACEMENT
Anesthesia: General | Laterality: Left

## 2017-09-08 MED ORDER — CHLORHEXIDINE GLUCONATE CLOTH 2 % EX PADS: 6.0000 | MEDICATED_PAD | Freq: Every day | CUTANEOUS | Status: DC

## 2017-09-08 MED ORDER — SULFAMETHOXAZOLE-TRIMETHOPRIM 800-160 MG PO TABS: 1.0000 | ORAL_TABLET | Freq: Two times a day (BID) | ORAL | 0 refills | Status: DC

## 2017-09-08 MED ORDER — ACETAMINOPHEN 325 MG PO TABS
650.0000 mg | ORAL_TABLET | Freq: Four times a day (QID) | ORAL | Status: DC | PRN
  Administered 2017-09-08: 650 mg via ORAL
  Filled 2017-09-08 (×2): qty 2

## 2017-09-08 MED ORDER — LIVING WELL WITH DIABETES BOOK
Freq: Once | Status: AC
  Administered 2017-09-08: 13:00:00
  Filled 2017-09-08: qty 1

## 2017-09-08 MED ORDER — TAMSULOSIN HCL 0.4 MG PO CAPS: 0.4000 mg | ORAL_CAPSULE | Freq: Every day | ORAL | Status: DC

## 2017-09-08 MED ORDER — OXYCODONE HCL 5 MG PO TABS: 5.0000 mg | ORAL_TABLET | ORAL | 0 refills | Status: DC | PRN

## 2017-09-08 MED ORDER — ACETAMINOPHEN 325 MG PO TABS: 650.0000 mg | ORAL_TABLET | Freq: Four times a day (QID) | ORAL | 1 refills | Status: DC | PRN

## 2017-09-08 MED ORDER — CHLORHEXIDINE GLUCONATE CLOTH 2 % EX PADS: 6.0000 | MEDICATED_PAD | Freq: Every day | CUTANEOUS | 1 refills | Status: DC

## 2017-09-08 MED ORDER — TAMSULOSIN HCL 0.4 MG PO CAPS: 0.4000 mg | ORAL_CAPSULE | Freq: Every day | ORAL | 1 refills | Status: DC

## 2017-09-08 MED ORDER — PROMETHAZINE HCL 25 MG RE SUPP: 25.0000 mg | Freq: Four times a day (QID) | RECTAL | 1 refills | Status: DC | PRN

## 2017-09-08 MED ORDER — ONDANSETRON HCL 4 MG PO TABS
4.0000 mg | ORAL_TABLET | Freq: Three times a day (TID) | ORAL | Status: DC | PRN
Start: 2017-09-08 — End: 2017-09-08

## 2017-09-08 MED ORDER — MUPIROCIN 2 % EX OINT
1.0000 "application " | TOPICAL_OINTMENT | Freq: Two times a day (BID) | CUTANEOUS | Status: DC
  Filled 2017-09-08: qty 22

## 2017-09-08 MED ORDER — MUPIROCIN 2 % EX OINT: 1.0000 "application " | TOPICAL_OINTMENT | Freq: Two times a day (BID) | CUTANEOUS | 0 refills | Status: DC

## 2017-09-08 MED ORDER — OXYCODONE HCL 5 MG PO TABS: 5.0000 mg | ORAL_TABLET | ORAL | Status: DC | PRN

## 2017-09-08 MED ORDER — LEVOFLOXACIN IN D5W 500 MG/100ML IV SOLN
500.0000 mg | INTRAVENOUS | Status: DC
  Filled 2017-09-08: qty 100

## 2017-09-08 SURGICAL SUPPLY — 27 items
BAG DRAIN CYSTO-URO LG1000N (MISCELLANEOUS) ×2 IMPLANT
CATH URETL 5X70 OPEN END (CATHETERS) ×2 IMPLANT
CNTNR SPEC 2.5X3XGRAD LEK (MISCELLANEOUS) ×1
CONRAY 43 FOR UROLOGY 50M (MISCELLANEOUS) ×2 IMPLANT
CONT SPEC 4OZ STER OR WHT (MISCELLANEOUS) ×1
CONTAINER SPEC 2.5X3XGRAD LEK (MISCELLANEOUS) ×1 IMPLANT
FIBER LASER 365 (Laser) IMPLANT
GLOVE BIO SURGEON STRL SZ7 (GLOVE) ×4 IMPLANT
GLOVE BIO SURGEON STRL SZ7.5 (GLOVE) ×2 IMPLANT
GOWN STRL REUS W/ TWL LRG LVL3 (GOWN DISPOSABLE) ×1 IMPLANT
GOWN STRL REUS W/ TWL LRG LVL4 (GOWN DISPOSABLE) ×1 IMPLANT
GOWN STRL REUS W/ TWL XL LVL3 (GOWN DISPOSABLE) ×1 IMPLANT
GOWN STRL REUS W/TWL LRG LVL3 (GOWN DISPOSABLE) ×1
GOWN STRL REUS W/TWL LRG LVL4 (GOWN DISPOSABLE) ×1
GOWN STRL REUS W/TWL XL LVL3 (GOWN DISPOSABLE) ×1
GOWN STRL REUS W/TWL XL LVL4 (GOWN DISPOSABLE) ×2 IMPLANT
GUIDEWIRE STR ZIPWIRE 035X150 (MISCELLANEOUS) ×2 IMPLANT
KIT TURNOVER CYSTO (KITS) ×2 IMPLANT
PACK CYSTO AR (MISCELLANEOUS) ×2 IMPLANT
PREP PVP WINGED SPONGE (MISCELLANEOUS) ×2 IMPLANT
SET CYSTO W/LG BORE CLAMP LF (SET/KITS/TRAYS/PACK) ×2 IMPLANT
SOL .9 NS 3000ML IRR  AL (IV SOLUTION) ×1
SOL .9 NS 3000ML IRR UROMATIC (IV SOLUTION) ×1 IMPLANT
SOL PREP PVP 2OZ (MISCELLANEOUS) ×2
SOLUTION PREP PVP 2OZ (MISCELLANEOUS) ×1 IMPLANT
SURGILUBE 2OZ TUBE FLIPTOP (MISCELLANEOUS) ×2 IMPLANT
WATER STERILE IRR 1000ML POUR (IV SOLUTION) ×2 IMPLANT

## 2017-09-08 NOTE — Progress Notes (Addendum)
Subjective: Patient reports no pain since yesterday.  Objective: Vital signs in last 24 hours: Temp:  [97.7 F (36.5 C)-98.3 F (36.8 C)] 98.2 F (36.8 C) (03/14 1152) Pulse Rate:  [79-99] 85 (03/14 1152) Resp:  [16-20] 20 (03/14 1152) BP: (93-167)/(50-97) 116/64 (03/14 1152) SpO2:  [87 %-98 %] 96 % (03/14 1152) Weight:  [145.2 kg (320 lb)] 145.2 kg (320 lb) (03/13 1538)  Intake/Output from previous day: 03/13 0701 - 03/14 0700 In: 950 [I.V.:450; IV Piggyback:500] Out: 200 [Urine:200] Intake/Output this shift: Total I/O In: 786 [I.V.:786] Out: 150 [Urine:150]  Physical Exam:  General: Alert and oriented    Lab Results: Recent Labs    09/07/17 1541  HGB 15.1  HCT 44.9   BMET Recent Labs    09/07/17 1541 09/07/17 2136  NA 138 142  K 3.9 4.0  CL 107 109  CO2 19* 19*  GLUCOSE 288* 303*  BUN 15 15  CREATININE 0.81 0.87  CALCIUM 9.3 8.9   No results for input(s): LABPT, INR in the last 72 hours. No results for input(s): LABURIN in the last 72 hours. Results for orders placed or performed during the hospital encounter of 09/07/17  MRSA PCR Screening     Status: Abnormal   Collection Time: 09/08/17 12:08 PM  Result Value Ref Range Status   MRSA by PCR POSITIVE (A) NEGATIVE Final    Comment:        The GeneXpert MRSA Assay (FDA approved for NASAL specimens only), is one component of a comprehensive MRSA colonization surveillance program. It is not intended to diagnose MRSA infection nor to guide or monitor treatment for MRSA infections. RESULT CALLED TO, READ BACK BY AND VERIFIED WITH: CALLED TO LAUREN WITTENBROOK @1400  09/08/17 BY MGP Performed at [facility], [address] [place], [place] [address]     Studies/Results: Dg Abdomen 1 View  Result Date: 09/07/2017 CLINICAL DATA:  Acute onset of left flank pain. Assess known left ureteral stone. Nausea and vomiting. EXAM: ABDOMEN - 1 VIEW COMPARISON:  CT of the abdomen and pelvis  performed earlier today at 7:28 p.m. FINDINGS: The left ureteral stone noted on CT appears to have migrated distally to the distal left ureter, perhaps 6 cm above the left vesicoureteral junction. Nonobstructing left renal stones are again seen. The visualized bowel gas pattern is grossly unremarkable. No acute osseous abnormalities are identified. IMPRESSION: Left ureteral stone noted on CT appears to have migrated distally to the distal left ureter, perhaps 6 cm above the left vesicoureteral junction. Electronically Signed   By: Garald Balding M.D.   On: 09/07/2017 21:42   Ct Renal Stone Study  Result Date: 09/07/2017 CLINICAL DATA:  Kidney stones. EXAM: CT ABDOMEN AND PELVIS WITHOUT CONTRAST TECHNIQUE: Multidetector CT imaging of the abdomen and pelvis was performed following the standard protocol without IV contrast. COMPARISON:  None. FINDINGS: Lower chest: Lung bases are clear. Hepatobiliary: No focal hepatic lesion. No biliary duct dilatation. Gallbladder is normal. Common bile duct is normal. Pancreas: Pancreas is normal. No ductal dilatation. No pancreatic inflammation. Spleen: Normal spleen Adrenals/urinary tract: Adrenal glands normal. Obstructing calculus in the mid LEFT ureter measures 3 mm (image 51, series 2. Mild hydroureter and hydronephrosis on the LEFT. This calculus is at the L4-L3 vertebral body level. There are 3 additional calculi within LEFT kidney ranging size from 2-7 mm. No RIGHT nephrolithiasis or ureterolithiasis.  No bladder calculi. Stomach/Bowel: Stomach, small bowel, appendix, and cecum are normal. The colon and rectosigmoid colon are  normal. Vascular/Lymphatic: Abdominal aorta is normal caliber. No periportal or retroperitoneal adenopathy. No pelvic adenopathy. Reproductive: Post hysterectomy anatomy Other: No free fluid. Musculoskeletal: No aggressive osseous lesion. IMPRESSION: 1. Obstructing calculus in the mid LEFT ureter. Mild to moderate moderate obstructive uropathy on  the LEFT. 2. LEFT nephrolithiasis. Electronically Signed   By: Suzy Bouchard M.D.   On: 09/07/2017 19:52    Assessment/Plan:  Probable passed ureteral stone Home today    LOS: 0 days   [name] 09/08/2017, 2:51 PM

## 2017-09-08 NOTE — Progress Notes (Signed)
Nutrition Education Note   RD consulted for nutrition education regarding new diabetes.   52 year old female with a history of hyperglycemia and now diabetes admitted with kidney stones.   Lab Results  Component Value Date   HGBA1C 8.5 (H) 09/07/2017    RD provided "Nutrition and Type II Diabetes" handout from the Academy of Nutrition and Dietetics. Discussed different food groups and their effects on blood sugar, emphasizing carbohydrate-containing foods. Provided list of carbohydrates and recommended serving sizes of common foods.  Discussed importance of controlled and consistent carbohydrate intake throughout the day. Provided examples of ways to balance meals/snacks and encouraged intake of high-fiber, whole grain complex carbohydrates. Teach back method used.  Expect good compliance.  Body mass index is 51.65 kg/m. Pt meets criteria for morbid obesity based on current BMI. Pt has been working with her physician to loose weight and reports that she has lost 20lbs over the past two months.   Pt reports good appetite and oral intake pta. Pt is NPO today for kidney stone removal later today. Pt reports that she was unaware that she now has diabetes and began crying when she was told what her new AIC from yesterday was. Pt reports that her AIC was around 7 when she had it checked in December. 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   Labs and medications reviewed. No further nutrition interventions warranted at this time. RD contact information provided. If additional nutrition issues arise, please re-consult RD.  Koleen Distance MS, RD, LDN Pager #- [phone] After Hours Pager: [phone]

## 2017-09-08 NOTE — Progress Notes (Addendum)
Inpatient Diabetes Program Recommendations  AACE/ADA: New Consensus Statement on Inpatient Glycemic Control (2015)  Target Ranges:  Prepandial:   less than 140 mg/dL      Peak postprandial:   less than 180 mg/dL (1-2 hours)      Critically ill patients:  140 - 180 mg/dL  Results for Grace Flores, Grace Flores (MRN [id number]) as of 09/08/2017 08:00  Ref. Range 09/07/2017 22:09 09/08/2017 07:33  Glucose-Capillary Latest Ref Range: 65 - 99 mg/dL 301 (H) 148 (H)   Results for Grace Flores, Grace Flores (MRN [id number]) as of 09/08/2017 08:00  Ref. Range 09/07/2017 15:41 09/07/2017 21:36  Glucose Latest Ref Range: 65 - 99 mg/dL 288 (H) 303 (H)  Hemoglobin A1C Latest Ref Range: 4.8 - 5.6 %  8.5 (H)   Review of Glycemic Control  Diabetes history: Hyperglycemia hx (per PCP note) Outpatient Diabetes medications: None Current orders for Inpatient glycemic control: Novolog 0-9 units TID with meals, Novolog 0-5 units QHS  Inpatient Diabetes Program Recommendations:  HgbA1C: Per Care Everywhere, A1C was 7.8% on 06/15/2017. Patient seen her PCP on 06/23/17 and patient defered DM meds with plan to change diet, exercise and lose weight. Current A1C 8.5% on 09/07/2017 indicating an average glucose of 197 mg/dl over the past 2-3 months. Oral DM medications: MD may want to consider ordering Metformin 500 mg BID. Outpatient Plan for DM control: Patient would benefit with being prescribed oral DM medication at time of discharge and follow up with PCP. However, patient refuses to take any DM medications at this time and has an agreement with PCP that if she does not get glucose controlled by July then she will take DM medication(s) per her PCP.  NOTE: In reviewing chart, noted patient has a history of hyperglycemia per PCP office note on 06/23/17. A1C was 7.8% on 06/15/2017 and now 8.5% on 09/07/2017. Per ADA, if A1C is 6.5% or greater then criteria is met to dx with DM. Ordered: Living Well with DM book, RD consult for diet education,  and DM education by bedside RNs. Diabetes Coordinator will plan to see patient today.  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 Patient states she is aware that her last A1C was elevated and that it is a little higher now at 8.5%. Patient expressed frustration with being told she has DM and she is not willing to accept that she has DM right now but that she has hyperglycemia and would like for people to stop trying to tell her she has DM. Discussed American Diabetes Association criteria for dx of DM and if A1C is 6.5% or greater then criteria is met. . Patient reports that her mother and father have DM and his father's is diet controlled. Patient has talked with her PCP (Dr. Doy Hutching) and she wanted an opportunity to get glucose controlled with diet, exercise, and weight loss. Patient states that she has lost 10 pounds since she seen her PCP 06/23/17 and she has purchased an elliptical so she can work out at home. Patient states that she plans to stick to the plan she has with her PCP and if her A1C is still elevated in July then she will agree to take DM medications. At this time she will not even consider taking DM medications or that she has a dx of DM. Patient verbalized understanding of  information discussed and states that she does not want to talk anymore about it.  Thanks, Barnie Alderman, RN, MSN, CDE Diabetes Coordinator Inpatient Diabetes Program [phone] (Team Pager from 8am to 5pm)

## 2017-09-08 NOTE — Discharge Summary (Signed)
Physician Discharge Summary   Patient ID: Grace Flores [id number] y.o. [birth year]  Admit date: 09/07/2017  Discharge date and time: No discharge date for patient encounter.   Admitting Physician: [name], MD   Discharge Physician: Yves Dill  Admission Diagnoses: Left flank pain [R10.9] Kidney stone on left side [N20.0] Intractable vomiting with nausea, unspecified vomiting type [R11.2]  Discharge Diagnoses: 1.  Left ureterolithiasis with renal colic                                         2.  Intractable nausea and vomiting                                         3.  Diabetes mellitus  Admission Condition: fair  Discharged Condition: Satisfactory  Indication for Admission: 1.  Left ureterolithiasis with renal colic                                             2.  Intractable nausea and vomiting                                             3.  Diabetes mellitus    Hospital Course: The patient was admitted and given IV fluids, IV analgesia and IV nausea medication.  Her symptoms resolved overnight and she may have passed her kidney stone. She was treated with sliding scale insulin for a new diagnosis of diabetes. However a stone was not retrieved.  Tolerating regular diet and having at the time of discharge.  She was instructed to follow-up in the office in 1 week.  She was also instructed to follow-up with Dr. Doy Hutching in 1 week regarding the diabetes and positive MRSA nasal swab.  Consults: Internal medicine  Significant Diagnostic Studies: CT      Disposition: Home  Patient Instructions:  Allergies as of 09/08/2017      Reactions   Tape    Blistering skin and skin comes off      Medication List    TAKE these medications   acetaminophen 325 MG tablet Commonly known as:  TYLENOL Take 2 tablets (650 mg total) by mouth every 6 (six) hours as needed for mild pain or headache.   amoxicillin-clavulanate 875-125 MG tablet Commonly known as:  AUGMENTIN Take 1  tablet by mouth every 12 (twelve) hours.   atorvastatin 10 MG tablet Commonly known as:  LIPITOR Take 1 tablet by mouth daily.   Chlorhexidine Gluconate Cloth 2 % Pads Apply 6 each topically daily at 6 (six) AM. Start taking on:  09/09/2017   mupirocin ointment 2 % Commonly known as:  BACTROBAN Place 1 application into the nose 2 (two) times daily.   ondansetron 4 MG tablet Commonly known as:  ZOFRAN Take 1 tablet by mouth every 12 (twelve) hours.   oxyCODONE 5 MG immediate release tablet Commonly known as:  Oxy IR/ROXICODONE Take 1-2 tablets (5-10 mg total) by mouth every 4 (four) hours as needed for severe pain.   promethazine 25 MG suppository Commonly known  asNada Libman Place 1 suppository (25 mg total) rectally every 6 (six) hours as needed for nausea.   sulfamethoxazole-trimethoprim 800-160 MG tablet Commonly known as:  BACTRIM DS,SEPTRA DS Take 1 tablet by mouth 2 (two) times daily.   tamsulosin 0.4 MG Caps capsule Commonly known as:  FLOMAX Take 1 capsule by mouth daily. What changed:  Another medication with the same name was added. Make sure you understand how and when to take each.   tamsulosin 0.4 MG Caps capsule Commonly known as:  FLOMAX Take 1 capsule (0.4 mg total) by mouth daily. What changed:  You were already taking a medication with the same name, and this prescription was added. Make sure you understand how and when to take each.      Activity: activity as tolerated Diet: regular diet Wound Care: none needed  Follow-up with Dr. Eliberto Ivory and Dr. Doy Hutching in 1 week   Signed: [name] MD 09/08/2017 3:27 PM

## 2017-09-08 NOTE — Progress Notes (Signed)
Grace Flores  A and O x 4. VSS. Pt tolerating diet well. No complaints of pain or nausea. IV removed intact, prescriptions given. Pt voiced understanding of discharge instructions with no further questions.   Grace Flores   Allergies as of 09/08/2017      Reactions   Tape    Blistering skin and skin comes off      Medication List    TAKE these medications   acetaminophen 325 MG tablet Commonly known as:  TYLENOL Take 2 tablets (650 mg total) by mouth every 6 (six) hours as needed for mild pain or headache.   amoxicillin-clavulanate 875-125 MG tablet Commonly known as:  AUGMENTIN Take 1 tablet by mouth every 12 (twelve) hours.   atorvastatin 10 MG tablet Commonly known as:  LIPITOR Take 1 tablet by mouth daily.   Chlorhexidine Gluconate Cloth 2 % Pads Apply 6 each topically daily at 6 (six) AM. Start taking on:  09/09/2017   mupirocin ointment 2 % Commonly known as:  BACTROBAN Place 1 application into the nose 2 (two) times daily.   ondansetron 4 MG tablet Commonly known as:  ZOFRAN Take 1 tablet by mouth every 12 (twelve) hours.   oxyCODONE 5 MG immediate release tablet Commonly known as:  Oxy IR/ROXICODONE Take 1-2 tablets (5-10 mg total) by mouth every 4 (four) hours as needed for severe pain.   promethazine 25 MG suppository Commonly known as:  PHENERGAN Place 1 suppository (25 mg total) rectally every 6 (six) hours as needed for nausea.   sulfamethoxazole-trimethoprim 800-160 MG tablet Commonly known as:  BACTRIM DS,SEPTRA DS Take 1 tablet by mouth 2 (two) times daily.   tamsulosin 0.4 MG Caps capsule Commonly known as:  FLOMAX Take 1 capsule by mouth daily. What changed:  Another medication with the same name was added. Make sure you understand how and when to take each.   tamsulosin 0.4 MG Caps capsule Commonly known as:  FLOMAX Take 1 capsule (0.4 mg total) by mouth daily. What changed:  You were already taking a medication with the same name,  and this prescription was added. Make sure you understand how and when to take each.       Vitals:   09/08/17 0504 09/08/17 1152  BP: (!) 103/50 116/64  Pulse: 79 85  Resp: 20 20  Temp: 98.3 F (36.8 C) 98.2 F (36.8 C)  SpO2: 92% 96%

## 2017-09-08 NOTE — Progress Notes (Signed)
[place] [facility] [place] NAME: Grace Flores    MR#:  [id number]  DATE OF BIRTH:  [birth year]  SUBJECTIVE:   Patient without pain  REVIEW OF SYSTEMS:    Review of Systems  Constitutional: Negative for fever, chills weight loss HENT: Negative for ear pain, nosebleeds, congestion, facial swelling, rhinorrhea, neck pain, neck stiffness and ear discharge.   Respiratory: Negative for cough, shortness of breath, wheezing  Cardiovascular: Negative for chest pain, palpitations and leg swelling.  Gastrointestinal: Negative for heartburn, abdominal pain, vomiting, diarrhea or consitpation Genitourinary: Negative for dysuria, urgency, frequency, hematuria Musculoskeletal: Negative for back pain or joint pain Neurological: Negative for dizziness, seizures, syncope, focal weakness,  numbness and headaches.  Hematological: Does not bruise/bleed easily.  Psychiatric/Behavioral: Negative for hallucinations, confusion, dysphoric mood    Tolerating Diet: npo      DRUG ALLERGIES:   Allergies  Allergen Reactions  . Tape     Blistering skin and skin comes off    VITALS:  Blood pressure (!) 103/50, pulse 79, temperature 98.3 F (36.8 C), temperature source Oral, resp. rate 20, height 5\' 6"  (1.676 m), weight (!) 145.2 kg (320 lb), SpO2 92 %.  PHYSICAL EXAMINATION:  Constitutional: Appears well-developed and well-nourished. No distress. HENT: Normocephalic. Marland Kitchen Oropharynx is clear and moist.  Eyes: Conjunctivae and EOM are normal. PERRLA, no scleral icterus.  Neck: Normal ROM. Neck supple. No JVD. No tracheal deviation. CVS: RRR, S1/S2 +, no murmurs, no gallops, no carotid bruit.  Pulmonary: Effort and breath sounds normal, no stridor, rhonchi, wheezes, rales.  Abdominal: Soft. BS +,  no distension, tenderness, rebound or guarding.  Musculoskeletal: Normal range of motion. No edema and no tenderness.  Neuro: Alert. CN 2-12 grossly intact. No focal  deficits. Skin: Skin is warm and dry. No rash noted. Psychiatric: Normal mood and affect.      LABORATORY PANEL:   CBC Recent Labs  Lab 09/07/17 1541  WBC 7.2  HGB 15.1  HCT 44.9  PLT 210   ------------------------------------------------------------------------------------------------------------------  Chemistries  Recent Labs  Lab 09/07/17 1541 09/07/17 2136  NA 138 142  K 3.9 4.0  CL 107 109  CO2 19* 19*  GLUCOSE 288* 303*  BUN 15 15  CREATININE 0.81 0.87  CALCIUM 9.3 8.9  AST 32  --   ALT 32  --   ALKPHOS 87  --   BILITOT 0.7  --    ------------------------------------------------------------------------------------------------------------------  Cardiac Enzymes No results for input(s): TROPONINI in the last 168 hours. ------------------------------------------------------------------------------------------------------------------  RADIOLOGY:  Dg Abdomen 1 View  Result Date: 09/07/2017 CLINICAL DATA:  Acute onset of left flank pain. Assess known left ureteral stone. Nausea and vomiting. EXAM: ABDOMEN - 1 VIEW COMPARISON:  CT of the abdomen and pelvis performed earlier today at 7:28 p.m. FINDINGS: The left ureteral stone noted on CT appears to have migrated distally to the distal left ureter, perhaps 6 cm above the left vesicoureteral junction. Nonobstructing left renal stones are again seen. The visualized bowel gas pattern is grossly unremarkable. No acute osseous abnormalities are identified. IMPRESSION: Left ureteral stone noted on CT appears to have migrated distally to the distal left ureter, perhaps 6 cm above the left vesicoureteral junction. Electronically Signed   By: Garald Balding M.D.   On: 09/07/2017 21:42   Ct Renal Stone Study  Result Date: 09/07/2017 CLINICAL DATA:  Kidney stones. EXAM: CT ABDOMEN AND PELVIS WITHOUT CONTRAST TECHNIQUE: Multidetector CT imaging of the abdomen and pelvis was  performed following the standard protocol without IV  contrast. COMPARISON:  None. FINDINGS: Lower chest: Lung bases are clear. Hepatobiliary: No focal hepatic lesion. No biliary duct dilatation. Gallbladder is normal. Common bile duct is normal. Pancreas: Pancreas is normal. No ductal dilatation. No pancreatic inflammation. Spleen: Normal spleen Adrenals/urinary tract: Adrenal glands normal. Obstructing calculus in the mid LEFT ureter measures 3 mm (image 51, series 2. Mild hydroureter and hydronephrosis on the LEFT. This calculus is at the L4-L3 vertebral body level. There are 3 additional calculi within LEFT kidney ranging size from 2-7 mm. No RIGHT nephrolithiasis or ureterolithiasis.  No bladder calculi. Stomach/Bowel: Stomach, small bowel, appendix, and cecum are normal. The colon and rectosigmoid colon are normal. Vascular/Lymphatic: Abdominal aorta is normal caliber. No periportal or retroperitoneal adenopathy. No pelvic adenopathy. Reproductive: Post hysterectomy anatomy Other: No free fluid. Musculoskeletal: No aggressive osseous lesion. IMPRESSION: 1. Obstructing calculus in the mid LEFT ureter. Mild to moderate moderate obstructive uropathy on the LEFT. 2. LEFT nephrolithiasis. Electronically Signed   By: Suzy Bouchard M.D.   On: 09/07/2017 19:52     ASSESSMENT AND PLAN:   52 year old female with a history of hyperglycemia and now diabetes with a A1c of 8.5 in March who presents with flank pain.  1.  Diabetes: Patient absolutely refuses any oral medications or insulin.  She says she will follow-up with her primary care physician.  Her and her primary care physician have been trying diet controlled for her diabetes.  2.  Left ureterolithiasis with hydronephrosis: Plan for lithotripsy and stent placement today. Case discussed with urology Pain is controlled   3.  Morbid obesity: Patient encouraged to continue to lose weight.  She has lost 20 pounds over the past several months.   Management plans discussed with the patient and she is in  agreement.  CODE STATUS: Full  TOTAL TIME TAKING CARE OF THIS PATIENT: 30 minutes.   Plan of care discussed with Dr. Yves Dill.  I will sign off as patient will be discharged later today.  POSSIBLE D/C today, DEPENDING ON CLINICAL CONDITION.   Lonie Rummell M.D on 09/08/2017 at 10:36 AM  Between 7am to 6pm - Pager - [phone] After 6pm go to [email or web address] - password [email or web address] [facility]  Office  [phone]  CC: Primary care physician; Idelle Crouch, MD  Note: This dictation was prepared with Dragon dictation along with smaller phrase technology. Any transcriptional errors that result from this process are unintentional.

## 2017-09-09 LAB — URINE CULTURE
CULTURE: NO GROWTH
SPECIAL REQUESTS: NORMAL

## 2017-09-16 ENCOUNTER — Emergency Department
Admission: EM | Admit: 2017-09-16 | Discharge: 2017-09-16 | Disposition: A | Discharge status: Home / Self Care | Payer: Commercial Managed Care - PPO | Attending: Emergency Medicine | Admitting: Emergency Medicine

## 2017-09-16 ENCOUNTER — Emergency Department: Payer: Commercial Managed Care - PPO

## 2017-09-16 ENCOUNTER — Encounter: Payer: Self-pay | Admitting: Emergency Medicine

## 2017-09-16 DIAGNOSIS — R1030 Lower abdominal pain, unspecified: Secondary | ICD-10-CM | POA: Diagnosis present

## 2017-09-16 DIAGNOSIS — N200 Calculus of kidney: Secondary | ICD-10-CM | POA: Diagnosis not present

## 2017-09-16 LAB — CBC WITH DIFFERENTIAL/PLATELET
BASOS ABS: 0.1 10*3/uL (ref 0–0.1)
Basophils Relative: 1 %
EOS ABS: 0.2 10*3/uL (ref 0–0.7)
EOS PCT: 4 %
HCT: 45.4 % (ref 35.0–47.0)
HEMOGLOBIN: 15.1 g/dL (ref 12.0–16.0)
LYMPHS ABS: 1.9 10*3/uL (ref 1.0–3.6)
Lymphocytes Relative: 35 %
MCH: 28.9 pg (ref 26.0–34.0)
MCHC: 33.3 g/dL (ref 32.0–36.0)
MCV: 86.8 fL (ref 80.0–100.0)
Monocytes Absolute: 0.6 10*3/uL (ref 0.2–0.9)
Monocytes Relative: 11 %
NEUTROS PCT: 49 %
Neutro Abs: 2.8 10*3/uL (ref 1.4–6.5)
Platelets: 197 10*3/uL (ref 150–440)
RBC: 5.23 MIL/uL — AB (ref 3.80–5.20)
RDW: 13.8 % (ref 11.5–14.5)
WBC: 5.6 10*3/uL (ref 3.6–11.0)

## 2017-09-16 LAB — URINALYSIS, COMPLETE (UACMP) WITH MICROSCOPIC
BILIRUBIN URINE: NEGATIVE
GLUCOSE, UA: NEGATIVE mg/dL
KETONES UR: NEGATIVE mg/dL
NITRITE: NEGATIVE
Protein, ur: NEGATIVE mg/dL
Specific Gravity, Urine: 1.011 (ref 1.005–1.030)
pH: 5 (ref 5.0–8.0)

## 2017-09-16 LAB — COMPREHENSIVE METABOLIC PANEL
ALT: 41 U/L (ref 14–54)
AST: 33 U/L (ref 15–41)
Albumin: 4.2 g/dL (ref 3.5–5.0)
Alkaline Phosphatase: 83 U/L (ref 38–126)
Anion gap: 9 (ref 5–15)
BILIRUBIN TOTAL: 0.7 mg/dL (ref 0.3–1.2)
BUN: 14 mg/dL (ref 6–20)
CHLORIDE: 107 mmol/L (ref 101–111)
CO2: 23 mmol/L (ref 22–32)
Calcium: 9.4 mg/dL (ref 8.9–10.3)
Creatinine, Ser: 0.81 mg/dL (ref 0.44–1.00)
Glucose, Bld: 159 mg/dL — ABNORMAL HIGH (ref 65–99)
Potassium: 4 mmol/L (ref 3.5–5.1)
Sodium: 139 mmol/L (ref 135–145)
TOTAL PROTEIN: 7.3 g/dL (ref 6.5–8.1)

## 2017-09-16 LAB — GLUCOSE, CAPILLARY: Glucose-Capillary: 110 mg/dL — ABNORMAL HIGH (ref 65–99)

## 2017-09-16 MED ORDER — OXYCODONE-ACETAMINOPHEN 5-325 MG PO TABS
1.0000 | ORAL_TABLET | ORAL | Status: DC | PRN
  Administered 2017-09-16: 1 via ORAL
  Filled 2017-09-16: qty 1

## 2017-09-16 MED ORDER — ONDANSETRON HCL 4 MG/2ML IJ SOLN
4.0000 mg | Freq: Once | INTRAMUSCULAR | Status: AC
  Administered 2017-09-16: 4 mg via INTRAVENOUS
  Filled 2017-09-16: qty 2

## 2017-09-16 MED ORDER — HYDROMORPHONE HCL 1 MG/ML IJ SOLN
0.5000 mg | Freq: Once | INTRAMUSCULAR | Status: AC
  Administered 2017-09-16: 0.5 mg via INTRAVENOUS
  Filled 2017-09-16: qty 1

## 2017-09-16 MED ORDER — MORPHINE SULFATE (PF) 4 MG/ML IV SOLN: 4.0000 mg | Freq: Once | INTRAVENOUS | Status: DC

## 2017-09-16 MED ORDER — SODIUM CHLORIDE 0.9 % IV SOLN: 1.0000 g | Freq: Once | INTRAVENOUS | Status: DC

## 2017-09-16 NOTE — ED Provider Notes (Signed)
[facility] Emergency Department Provider Note       Time seen: ----------------------------------------- 6:00 PM on 09/16/2017 -----------------------------------------   I have reviewed the triage vital signs and the nursing notes.  HISTORY   Chief Complaint Flank Pain    HPI Grace Flores is a 52 y.o. female with a history of hyperlipidemia and kidney stones who presents to the ED for persistent left flank pain that radiates into her left groin.  She was recently diagnosed with a 7 mm kidney stone and has an appointment on the 28th of this month for lithotripsy.  She presents diaphoretic with worsening pain.  She reports she has not had to take her home medication very much.  She denies fevers, chills or other complaints.  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  Past Medical History:  Diagnosis Date  . Hyperlipidemia   . Kidney stones     Patient Active Problem List   Diagnosis Date Noted  . Kidney stone 09/08/2017    Past Surgical History:  Procedure Laterality Date  . ABDOMINAL HYSTERECTOMY    . LAPAROSCOPIC ENDOMETRIOSIS FULGURATION      Allergies Tape  Social History Social History   Tobacco Use  . Smoking status: Never Smoker  . Smokeless tobacco: Never Used  Substance Use Topics  . Alcohol use: Yes  . Drug use: No   Review of Systems Constitutional: Negative for fever. Cardiovascular: Negative for chest pain. Respiratory: Negative for shortness of breath. Gastrointestinal: Positive for flank pain Genitourinary: Negative for dysuria. Musculoskeletal: Negative for back pain. Skin: Positive for diaphoresis Neurological: Negative for headaches, focal weakness or numbness.  All systems negative/normal/unremarkable except as stated in the HPI  ____________________________________________   PHYSICAL EXAM:  VITAL SIGNS: ED Triage Vitals  Enc Vitals Group     BP 09/16/17 1328 115/75     Pulse Rate 09/16/17  1328 78     Resp 09/16/17 1328 20     Temp 09/16/17 1328 97.7 F (36.5 C)     Temp Source 09/16/17 1328 Oral     SpO2 09/16/17 1328 96 %     Weight 09/16/17 1328 (!) 320 lb (145.2 kg)     Height 09/16/17 1328 5\' 6"  (1.676 m)     Head Circumference --      Peak Flow --      Pain Score 09/16/17 1333 4     Pain Loc --      Pain Edu? --      Excl. in [place]? --    Constitutional: Alert and oriented. Well appearing and in no distress. Eyes: Conjunctivae are normal. Normal extraocular movements. Cardiovascular: Normal rate, regular rhythm. No murmurs, rubs, or gallops. Respiratory: Normal respiratory effort without tachypnea nor retractions. Breath sounds are clear and equal bilaterally. No wheezes/rales/rhonchi. Gastrointestinal: Left flank tenderness, no rebound or guarding.  Normal bowel sounds. Musculoskeletal: Nontender with normal range of motion in extremities. No lower extremity tenderness nor edema. Neurologic:  Normal speech and language. No gross focal neurologic deficits are appreciated.  Skin:  Skin is warm, dry and intact. No rash noted. ____________________________________________  ED COURSE:  As part of my medical decision making, I reviewed the following data within the [place] History obtained from family if available, nursing notes, old chart and ekg, as well as notes from prior ED visits. Patient presented for left flank pain and known kidney stone, we will assess with labs and imaging as indicated at this time.  Procedures ____________________________________________   LABS (pertinent positives/negatives)  Labs Reviewed  COMPREHENSIVE METABOLIC PANEL - Abnormal; Notable for the following components:      Result Value   Glucose, Bld 159 (*)    All other components within normal limits  CBC WITH DIFFERENTIAL/PLATELET - Abnormal; Notable for the following components:   RBC 5.23 (*)    All other components within normal limits  URINALYSIS,  COMPLETE (UACMP) WITH MICROSCOPIC - Abnormal; Notable for the following components:   Color, Urine YELLOW (*)    APPearance HAZY (*)    Hgb urine dipstick MODERATE (*)    Leukocytes, UA SMALL (*)    Bacteria, UA RARE (*)    Squamous Epithelial / LPF 0-5 (*)    All other components within normal limits  GLUCOSE, CAPILLARY - Abnormal; Notable for the following components:   Glucose-Capillary 110 (*)    All other components within normal limits  URINE CULTURE  CBG MONITORING, ED    RADIOLOGY  Renal ultrasound IMPRESSION: Left nephrolithiasis without evidence of obstructive uropathy.   ____________________________________________  DIFFERENTIAL DIAGNOSIS   Renal colic, pyelonephritis, shingles, muscle strain  FINAL ASSESSMENT AND PLAN  Kidney stone   Plan: The patient had presented for flank pain with known renal colic and schedule lithotripsy. Patient's labs are grossly reassuring, we have sent a urine culture. Patient's imaging not reveal any active obstruction.  I did offer admission for pain control and she has declined.  She was given morphine and Zofran here and is stable for outpatient follow-up.   Laurence Aly, MD   Note: This note was generated in part or whole with voice recognition software. Voice recognition is usually quite accurate but there are transcription errors that can and very often do occur. I apologize for any typographical errors that were not detected and corrected.     Earleen Newport, MD 09/16/17 [phone]

## 2017-09-16 NOTE — ED Triage Notes (Signed)
Patient presents to ED via POV from home with c/o left flank pain. Recently dx with 69mm kidney stone. Patient has an appointment on the 28th of this month for lithotripsy. Patient diaphoretic in triage.

## 2017-09-17 LAB — URINE CULTURE
CULTURE: NO GROWTH
SPECIAL REQUESTS: NORMAL

## 2017-09-22 ENCOUNTER — Other Ambulatory Visit: Payer: Self-pay

## 2017-09-22 ENCOUNTER — Encounter: Payer: Self-pay | Admitting: *Deleted

## 2017-09-22 ENCOUNTER — Ambulatory Visit
Admission: RE | Admit: 2017-09-22 | Discharge: 2017-09-22 | Disposition: A | Discharge status: Home / Self Care | Payer: Commercial Managed Care - PPO | Source: Ambulatory Visit | Attending: Urology | Admitting: Urology

## 2017-09-22 ENCOUNTER — Encounter
Admission: RE | Disposition: A | Discharge status: Home / Self Care | Payer: Self-pay | Source: Ambulatory Visit | Attending: Urology

## 2017-09-22 DIAGNOSIS — Z6841 Body Mass Index (BMI) 40.0 and over, adult: Secondary | ICD-10-CM | POA: Insufficient documentation

## 2017-09-22 DIAGNOSIS — Z7984 Long term (current) use of oral hypoglycemic drugs: Secondary | ICD-10-CM | POA: Insufficient documentation

## 2017-09-22 DIAGNOSIS — E119 Type 2 diabetes mellitus without complications: Secondary | ICD-10-CM | POA: Diagnosis not present

## 2017-09-22 DIAGNOSIS — Z79899 Other long term (current) drug therapy: Secondary | ICD-10-CM | POA: Diagnosis not present

## 2017-09-22 DIAGNOSIS — E669 Obesity, unspecified: Secondary | ICD-10-CM | POA: Insufficient documentation

## 2017-09-22 DIAGNOSIS — N200 Calculus of kidney: Secondary | ICD-10-CM | POA: Diagnosis not present

## 2017-09-22 DIAGNOSIS — Z888 Allergy status to other drugs, medicaments and biological substances status: Secondary | ICD-10-CM | POA: Insufficient documentation

## 2017-09-22 DIAGNOSIS — J45909 Unspecified asthma, uncomplicated: Secondary | ICD-10-CM | POA: Diagnosis not present

## 2017-09-22 DIAGNOSIS — Z8249 Family history of ischemic heart disease and other diseases of the circulatory system: Secondary | ICD-10-CM | POA: Diagnosis not present

## 2017-09-22 DIAGNOSIS — E7800 Pure hypercholesterolemia, unspecified: Secondary | ICD-10-CM | POA: Insufficient documentation

## 2017-09-22 HISTORY — PX: EXTRACORPOREAL SHOCK WAVE LITHOTRIPSY: SHX1557

## 2017-09-22 LAB — GLUCOSE, CAPILLARY: GLUCOSE-CAPILLARY: 130 mg/dL — AB (ref 65–99)

## 2017-09-22 SURGERY — LITHOTRIPSY, ESWL
Anesthesia: Moderate Sedation | Laterality: Left

## 2017-09-22 MED ORDER — DIPHENHYDRAMINE HCL 25 MG PO CAPS
ORAL_CAPSULE | ORAL | Status: AC
  Administered 2017-09-22: 25 mg via ORAL
  Filled 2017-09-22: qty 1

## 2017-09-22 MED ORDER — MIDAZOLAM HCL 2 MG/2ML IJ SOLN
1.0000 mg | Freq: Once | INTRAMUSCULAR | Status: AC
  Administered 2017-09-22: 1 mg via INTRAMUSCULAR

## 2017-09-22 MED ORDER — MORPHINE SULFATE (PF) 10 MG/ML IV SOLN
10.0000 mg | Freq: Once | INTRAVENOUS | Status: AC
  Administered 2017-09-22: 10 mg via INTRAMUSCULAR

## 2017-09-22 MED ORDER — PROMETHAZINE HCL 25 MG/ML IJ SOLN
INTRAMUSCULAR | Status: AC
  Administered 2017-09-22: 25 mg via INTRAMUSCULAR
  Filled 2017-09-22: qty 1

## 2017-09-22 MED ORDER — LEVOFLOXACIN 500 MG PO TABS
ORAL_TABLET | ORAL | Status: AC
  Administered 2017-09-22: 500 mg via ORAL
  Filled 2017-09-22: qty 1

## 2017-09-22 MED ORDER — OXYCODONE-ACETAMINOPHEN 10-325 MG PO TABS: 1.0000 | ORAL_TABLET | ORAL | 0 refills | Status: DC | PRN

## 2017-09-22 MED ORDER — FUROSEMIDE 10 MG/ML IJ SOLN
INTRAMUSCULAR | Status: AC
  Administered 2017-09-22: 10 mg via INTRAVENOUS
  Filled 2017-09-22: qty 2

## 2017-09-22 MED ORDER — MIDAZOLAM HCL 2 MG/2ML IJ SOLN
INTRAMUSCULAR | Status: AC
  Administered 2017-09-22: 1 mg via INTRAMUSCULAR
  Filled 2017-09-22: qty 2

## 2017-09-22 MED ORDER — ONDANSETRON HCL 4 MG PO TABS
ORAL_TABLET | ORAL | Status: AC
  Administered 2017-09-22: 4 mg
  Filled 2017-09-22: qty 1

## 2017-09-22 MED ORDER — LEVOFLOXACIN 500 MG PO TABS
500.0000 mg | ORAL_TABLET | Freq: Once | ORAL | Status: AC
  Administered 2017-09-22: 500 mg via ORAL

## 2017-09-22 MED ORDER — FUROSEMIDE 10 MG/ML IJ SOLN
10.0000 mg | Freq: Once | INTRAMUSCULAR | Status: AC
  Administered 2017-09-22: 10 mg via INTRAVENOUS

## 2017-09-22 MED ORDER — MORPHINE SULFATE (PF) 10 MG/ML IV SOLN
INTRAVENOUS | Status: AC
Start: 2017-09-22 — End: 2017-09-22
  Administered 2017-09-22: 10 mg via INTRAMUSCULAR
  Filled 2017-09-22: qty 1

## 2017-09-22 MED ORDER — DIPHENHYDRAMINE HCL 25 MG PO CAPS
25.0000 mg | ORAL_CAPSULE | Freq: Once | ORAL | Status: AC
  Administered 2017-09-22: 25 mg via ORAL

## 2017-09-22 MED ORDER — DEXTROSE-NACL 5-0.45 % IV SOLN
INTRAVENOUS | Status: DC
  Administered 2017-09-22: 12:00:00 via INTRAVENOUS

## 2017-09-22 MED ORDER — ONDANSETRON 4 MG PO TBDP: 4.0000 mg | ORAL_TABLET | Freq: Once | ORAL | Status: DC

## 2017-09-22 MED ORDER — TAMSULOSIN HCL 0.4 MG PO CAPS: 0.4000 mg | ORAL_CAPSULE | Freq: Every day | ORAL | 0 refills | Status: DC

## 2017-09-22 MED ORDER — PROMETHAZINE HCL 25 MG/ML IJ SOLN
25.0000 mg | Freq: Once | INTRAMUSCULAR | Status: AC
  Administered 2017-09-22: 25 mg via INTRAMUSCULAR

## 2017-09-22 NOTE — Discharge Instructions (Addendum)
Lithotripsy, Care After °This sheet gives you information about how to care for yourself after your procedure. Your health care provider may also give you more specific instructions. If you have problems or questions, contact your health care provider. °What can I expect after the procedure? °After the procedure, it is common to have: °· Some blood in your urine. This should only last for a few days. °· Soreness in your back, sides, or upper abdomen for a few days. °· Blotches or bruises on your back where the pressure wave entered the skin. °· Pain, discomfort, or nausea when pieces (fragments) of the kidney stone move through the tube that carries urine from the kidney to the bladder (ureter). Stone fragments may pass soon after the procedure, but they may continue to pass for up to 4-8 weeks. °? If you have severe pain or nausea, contact your health care provider. This may be caused by a large stone that was not broken up, and this may mean that you need more treatment. °· Some pain or discomfort during urination. °· Some pain or discomfort in the lower abdomen or (in men) at the base of the penis. ° °Follow these instructions at home: °Medicines °· Take over-the-counter and prescription medicines only as told by your health care provider. °· If you were prescribed an antibiotic medicine, take it as told by your health care provider. Do not stop taking the antibiotic even if you start to feel better. °· Do not drive for 24 hours if you were given a medicine to help you relax (sedative). °· Do not drive or use heavy machinery while taking prescription pain medicine. °Eating and drinking °· Drink enough water and fluids to keep your urine clear or pale yellow. This helps any remaining pieces of the stone to pass. It can also help prevent new stones from forming. °· Eat plenty of fresh fruits and vegetables. °· Follow instructions from your health care provider about eating and drinking restrictions. You may be  instructed: °? To reduce how much salt (sodium) you eat or drink. Check ingredients and nutrition facts on packaged foods and beverages. °? To reduce how much meat you eat. °· Eat the recommended amount of calcium for your age and gender. Ask your health care provider how much calcium you should have. °General instructions °· Get plenty of rest. °· Most people can resume normal activities 1-2 days after the procedure. Ask your health care provider what activities are safe for you. °· If directed, strain all urine through the strainer that was provided by your health care provider. °? Keep all fragments for your health care provider to see. Any stones that are found may be sent to a medical lab for examination. The stone may be as small as a grain of salt. °· Keep all follow-up visits as told by your health care provider. This is important. °Contact a health care provider if: °· You have pain that is severe or does not get better with medicine. °· You have nausea that is severe or does not go away. °· You have blood in your urine longer than your health care provider told you to expect. °· You have more blood in your urine. °· You have pain during urination that does not go away. °· You urinate more frequently than usual and this does not go away. °· You develop a rash or any other possible signs of an allergic reaction. °Get help right away if: °· You have severe pain in   your back, sides, or upper abdomen. °· You have severe pain while urinating. °· Your urine is very dark red. °· You have blood in your stool (feces). °· You cannot pass any urine at all. °· You feel a strong urge to urinate after emptying your bladder. °· You have a fever or chills. °· You develop shortness of breath, difficulty breathing, or chest pain. °· You have severe nausea that leads to persistent vomiting. °· You faint. °Summary °· After this procedure, it is common to have some pain, discomfort, or nausea when pieces (fragments) of the  kidney stone move through the tube that carries urine from the kidney to the bladder (ureter). If this pain or nausea is severe, however, you should contact your health care provider. °· Most people can resume normal activities 1-2 days after the procedure. Ask your health care provider what activities are safe for you. °· Drink enough water and fluids to keep your urine clear or pale yellow. This helps any remaining pieces of the stone to pass, and it can help prevent new stones from forming. °· If directed, strain your urine and keep all fragments for your health care provider to see. Fragments or stones may be as small as a grain of salt. °· Get help right away if you have severe pain in your back, sides, or upper abdomen or have severe pain while urinating. °This information is not intended to replace advice given to you by your health care provider. Make sure you discuss any questions you have with your health care provider. °Document Released: 07/04/2007 Document Revised: 05/05/2016 Document Reviewed: 05/05/2016 °Elsevier Interactive Patient Education © 2018 [employer] ° ° °AMBULATORY SURGERY  °DISCHARGE INSTRUCTIONS ° ° °1) The drugs that you were given will stay in your system until tomorrow so for the next 24 hours you should not: ° °A) Drive an automobile °B) Make any legal decisions °C) Drink any alcoholic beverage ° ° °2) You may resume regular meals tomorrow.  Today it is better to start with liquids and gradually work up to solid foods. ° °You may eat anything you prefer, but it is better to start with liquids, then soup and crackers, and gradually work up to solid foods. ° ° °3) Please notify your doctor immediately if you have any unusual bleeding, trouble breathing, redness and pain at the surgery site, drainage, fever, or pain not relieved by medication. ° ° ° °4) Additional Instructions: ° ° ° ° ° ° ° °Please contact your physician with any problems or Same Day Surgery at [phone], Monday  through Friday 6 am to 4 pm, or [place] [facility] [place] Main number at [phone]. °

## 2017-09-23 ENCOUNTER — Encounter: Payer: Self-pay | Admitting: Urology

## 2017-09-24 ENCOUNTER — Other Ambulatory Visit: Payer: Self-pay

## 2017-09-24 ENCOUNTER — Encounter: Payer: Self-pay | Admitting: Emergency Medicine

## 2017-09-24 DIAGNOSIS — N23 Unspecified renal colic: Secondary | ICD-10-CM | POA: Insufficient documentation

## 2017-09-24 DIAGNOSIS — Z79899 Other long term (current) drug therapy: Secondary | ICD-10-CM | POA: Diagnosis not present

## 2017-09-24 DIAGNOSIS — R1012 Left upper quadrant pain: Secondary | ICD-10-CM | POA: Diagnosis present

## 2017-09-24 LAB — COMPREHENSIVE METABOLIC PANEL
ALT: 25 U/L (ref 14–54)
ANION GAP: 9 (ref 5–15)
AST: 21 U/L (ref 15–41)
Albumin: 4 g/dL (ref 3.5–5.0)
Alkaline Phosphatase: 87 U/L (ref 38–126)
BUN: 13 mg/dL (ref 6–20)
CHLORIDE: 103 mmol/L (ref 101–111)
CO2: 23 mmol/L (ref 22–32)
CREATININE: 1.34 mg/dL — AB (ref 0.44–1.00)
Calcium: 9.2 mg/dL (ref 8.9–10.3)
GFR, EST AFRICAN AMERICAN: 53 mL/min — AB (ref >60)
GFR, EST NON AFRICAN AMERICAN: 46 mL/min — AB (ref >60)
Glucose, Bld: 173 mg/dL — ABNORMAL HIGH (ref 65–99)
POTASSIUM: 4 mmol/L (ref 3.5–5.1)
SODIUM: 135 mmol/L (ref 135–145)
Total Bilirubin: 1.1 mg/dL (ref 0.3–1.2)
Total Protein: 7.3 g/dL (ref 6.5–8.1)

## 2017-09-24 LAB — CBC
HCT: 42.7 % (ref 35.0–47.0)
HEMOGLOBIN: 14.5 g/dL (ref 12.0–16.0)
MCH: 29.3 pg (ref 26.0–34.0)
MCHC: 33.9 g/dL (ref 32.0–36.0)
MCV: 86.4 fL (ref 80.0–100.0)
Platelets: 204 10*3/uL (ref 150–440)
RBC: 4.94 MIL/uL (ref 3.80–5.20)
RDW: 13.8 % (ref 11.5–14.5)
WBC: 10.2 10*3/uL (ref 3.6–11.0)

## 2017-09-24 MED ORDER — ONDANSETRON HCL 4 MG/2ML IJ SOLN
4.0000 mg | Freq: Once | INTRAMUSCULAR | Status: AC | PRN
  Administered 2017-09-24: 4 mg via INTRAVENOUS
  Filled 2017-09-24: qty 2

## 2017-09-24 MED ORDER — SODIUM CHLORIDE 0.9 % IV BOLUS
1000.0000 mL | Freq: Once | INTRAVENOUS | Status: AC
  Administered 2017-09-24: 1000 mL via INTRAVENOUS

## 2017-09-24 MED ORDER — HALOPERIDOL LACTATE 5 MG/ML IJ SOLN
2.5000 mg | Freq: Once | INTRAMUSCULAR | Status: AC
  Administered 2017-09-24: 2.5 mg via INTRAVENOUS
  Filled 2017-09-24: qty 1

## 2017-09-24 MED ORDER — MORPHINE SULFATE (PF) 4 MG/ML IV SOLN
4.0000 mg | Freq: Once | INTRAVENOUS | Status: AC
  Administered 2017-09-24: 4 mg via INTRAVENOUS

## 2017-09-24 MED ORDER — MORPHINE SULFATE (PF) 4 MG/ML IV SOLN
INTRAVENOUS | Status: AC
  Filled 2017-09-24: qty 1

## 2017-09-24 MED ORDER — KETOROLAC TROMETHAMINE 30 MG/ML IJ SOLN
15.0000 mg | Freq: Once | INTRAMUSCULAR | Status: AC
  Administered 2017-09-24: 15 mg via INTRAVENOUS
  Filled 2017-09-24: qty 1

## 2017-09-24 NOTE — ED Notes (Signed)
Spoke with Dr. Mable Paris regarding patient requesting more pain medication.  Orders to be placed.

## 2017-09-24 NOTE — ED Triage Notes (Addendum)
Pt says she had Lithotripsy Thursday for 3 kidney stones on the left; pt says she's felt bad since the procedure but today started vomiting; cannot keep her medications down for pain and nausea; pt says she took Phenergan suppository about 45 minutes ago and has not vomited since; has not tried to drink anything either; says she feels dehydrated and needs IV fluids;

## 2017-09-25 ENCOUNTER — Emergency Department
Admission: EM | Admit: 2017-09-25 | Discharge: 2017-09-25 | Disposition: A | Discharge status: Home / Self Care | Payer: Commercial Managed Care - PPO | Attending: Emergency Medicine | Admitting: Emergency Medicine

## 2017-09-25 DIAGNOSIS — N23 Unspecified renal colic: Secondary | ICD-10-CM

## 2017-09-25 LAB — URINALYSIS, COMPLETE (UACMP) WITH MICROSCOPIC
Bacteria, UA: NONE SEEN
Bilirubin Urine: NEGATIVE
GLUCOSE, UA: 50 mg/dL — AB
KETONES UR: 20 mg/dL — AB
Nitrite: NEGATIVE
PH: 5 (ref 5.0–8.0)
Protein, ur: NEGATIVE mg/dL
SPECIFIC GRAVITY, URINE: 1.014 (ref 1.005–1.030)

## 2017-09-25 MED ORDER — IBUPROFEN 600 MG PO TABS: 600.0000 mg | ORAL_TABLET | Freq: Three times a day (TID) | ORAL | 0 refills | Status: DC | PRN

## 2017-09-25 MED ORDER — KETOROLAC TROMETHAMINE 30 MG/ML IJ SOLN
INTRAMUSCULAR | Status: AC
  Administered 2017-09-24: 15 mg via INTRAVENOUS
  Filled 2017-09-25: qty 1

## 2017-09-25 NOTE — ED Notes (Signed)
Reviewed discharge instructions, follow-up care, and prescriptions with patient. Patient verbalized understanding of all information reviewed. Patient stable, with no distress noted at this time.    

## 2017-09-25 NOTE — ED Notes (Signed)
Patient reports lithotripsy on Thursday for 52mm stone left kidney. Patient was prescribed Zofran, oxycodone, and phenergan suppository. Patient reports continued N/V with Zofran. pateint took phenergan approx. 45 minutes prior to arrival to this ED. Patient denies current nausea. Patient has been tolerating ice chips with no issue.

## 2017-09-25 NOTE — ED Notes (Signed)
Pt walked back to room and got into bed. Pt was sound asleep when I got her from subwait. Pt states she is in No pain. Rn notified

## 2017-09-25 NOTE — ED Notes (Signed)
ED Provider at bedside. 

## 2017-09-25 NOTE — ED Provider Notes (Signed)
[facility] Emergency Department Provider Note  ____________________________________________   First MD Initiated Contact with Patient 09/25/17 0222     (approximate)  I have reviewed the triage vital signs and the nursing notes.   HISTORY  Chief Complaint Abdominal Pain   HPI Grace Flores is a 52 y.o. female who self presents to the emergency department with moderate to severe throbbing aching left flank pain radiating to her left groin associated with nausea and vomiting.  2 days ago she had a lithotripsy for a 7 mm stone in her pain initially improved however recurred tonight which concerned her.  Her pain is intermittent.  Nothing seems to make it better or worse.  She denies dysuria frequency or hesitancy.  She denies fevers or chills.  Past Medical History:  Diagnosis Date  . Hyperlipidemia   . Kidney stones     Patient Active Problem List   Diagnosis Date Noted  . Kidney stone 09/08/2017    Past Surgical History:  Procedure Laterality Date  . ABDOMINAL HYSTERECTOMY    . EXTRACORPOREAL SHOCK WAVE LITHOTRIPSY Left 09/22/2017   Procedure: EXTRACORPOREAL SHOCK WAVE LITHOTRIPSY (ESWL);  Surgeon: [name], MD;  Location: [facility] ORS;  Service: Urology;  Laterality: Left;  . LAPAROSCOPIC ENDOMETRIOSIS FULGURATION      Prior to Admission medications   Medication Sig Start Date End Date Taking? Authorizing Provider  acetaminophen (TYLENOL) 325 MG tablet Take 2 tablets (650 mg total) by mouth every 6 (six) hours as needed for mild pain or headache. 09/08/17   [name], MD  atorvastatin (LIPITOR) 10 MG tablet Take 1 tablet by mouth daily. 07/18/17   [provider]  glimepiride (AMARYL) 4 MG tablet Take 4 mg by mouth daily with breakfast.    [provider]  ibuprofen (ADVIL,MOTRIN) 600 MG tablet Take 1 tablet (600 mg total) by mouth every 8 (eight) hours as needed. 09/25/17   Darel Hong, MD  ondansetron (ZOFRAN) 4  MG tablet Take 1 tablet by mouth every 12 (twelve) hours. 08/31/17   [provider]  oxyCODONE (OXY IR/ROXICODONE) 5 MG immediate release tablet Take 1-2 tablets (5-10 mg total) by mouth every 4 (four) hours as needed for severe pain. 09/08/17   [name], MD  oxyCODONE-acetaminophen (PERCOCET) 10-325 MG tablet Take 1-2 tablets by mouth every 4 (four) hours as needed for pain. Maximum dose per 24 hours - 8 pills 09/22/17   [name], MD  tamsulosin [facility]) 0.4 MG CAPS capsule Take 1 capsule (0.4 mg total) by mouth daily. 09/08/17   [name], MD  tamsulosin (FLOMAX) 0.4 MG CAPS capsule Take 1 capsule (0.4 mg total) by mouth daily. 09/22/17   [name], MD    Allergies Tape  History reviewed. No pertinent family history.  Social History Social History   Tobacco Use  . Smoking status: Never Smoker  . Smokeless tobacco: Never Used  Substance Use Topics  . Alcohol use: Yes  . Drug use: No    Review of Systems Constitutional: No fever/chills Eyes: No visual changes. ENT: No sore throat. Cardiovascular: Denies chest pain. Respiratory: Denies shortness of breath. Gastrointestinal: Positive for abdominal pain.  Positive for nausea, positive for vomiting.  No diarrhea.  No constipation. Genitourinary: Negative for dysuria. Musculoskeletal: Positive for back pain. Skin: Negative for rash. Neurological: Negative for headaches, focal weakness or numbness.   ____________________________________________   PHYSICAL EXAM:  VITAL SIGNS: ED Triage Vitals  Enc Vitals Group  BP 09/24/17 2153 (!) 152/96     Pulse Rate 09/24/17 2153 92     Resp 09/24/17 2153 (!) 22     Temp 09/24/17 2153 98.4 F (36.9 C)     Temp Source 09/24/17 2153 Oral     SpO2 09/24/17 2153 97 %     Weight 09/24/17 2154 (!) 320 lb (145.2 kg)     Height 09/24/17 2154 5\' 6"  (1.676 m)     Head Circumference --      Peak Flow --      Pain Score 09/24/17 2154 8     Pain Loc --       Pain Edu? --      Excl. in [place]? --     Constitutional: Alert and oriented x4 appears quite uncomfortable holding her left flank Eyes: PERRL EOMI. Head: Atraumatic. Nose: No congestion/rhinnorhea. Mouth/Throat: No trismus Neck: No stridor.   Cardiovascular: Normal rate, regular rhythm. Grossly normal heart sounds.  Good peripheral circulation. Respiratory: Normal respiratory effort.  No retractions. Lungs CTAB and moving good air Gastrointestinal: Soft nondistended nontender no rebound or guarding no peritonitis no costovertebral tenderness Musculoskeletal: No lower extremity edema   Neurologic:  Normal speech and language. No gross focal neurologic deficits are appreciated. Skin:  Skin is warm, dry and intact. No rash noted. Psychiatric: Mood and affect are normal. Speech and behavior are normal.    ____________________________________________   DIFFERENTIAL includes but not limited to  Kidney stone, pyelonephritis, infected stone ____________________________________________   LABS (all labs ordered are listed, but only abnormal results are displayed)  Labs Reviewed  COMPREHENSIVE METABOLIC PANEL - Abnormal; Notable for the following components:      Result Value   Glucose, Bld 173 (*)    Creatinine, Ser 1.34 (*)    GFR calc non Af Amer 46 (*)    GFR calc Af Amer 53 (*)    All other components within normal limits  URINALYSIS, COMPLETE (UACMP) WITH MICROSCOPIC - Abnormal; Notable for the following components:   Color, Urine YELLOW (*)    APPearance CLEAR (*)    Glucose, UA 50 (*)    Hgb urine dipstick SMALL (*)    Ketones, ur 20 (*)    Leukocytes, UA TRACE (*)    Squamous Epithelial / LPF 0-5 (*)    All other components within normal limits  CBC    Lab work reviewed by me with no evidence of  infection __________________________________________  EKG   ____________________________________________  RADIOLOGY   ____________________________________________   PROCEDURES  Procedure(s) performed: no  Procedures  Critical Care performed: no  Observation: no ____________________________________________   INITIAL IMPRESSION / ASSESSMENT AND PLAN / ED COURSE  Pertinent labs & imaging results that were available during my care of the patient were reviewed by me and considered in my medical decision making (see chart for details).  The patient arrives with left flank pain which is consistent with renal colic.  After Toradol and opioids the patient's pain is significantly improved and she is able to eat and drink.  She subsequently went to the bathroom and passed a small stone.  Fortunately she has no evidence of pyelonephritis or sepsis.  I had a lengthy discussion with the patient regarding the predicted clinical course and she is comfortable going home.  She is discharged home in improved condition verbalizes understanding and agreement with the plan.      ____________________________________________   FINAL CLINICAL IMPRESSION(S) / ED DIAGNOSES  Final diagnoses:  Renal  colic      NEW MEDICATIONS STARTED DURING THIS VISIT:  Discharge Medication List as of 09/25/2017  2:30 AM    START taking these medications   Details  ibuprofen (ADVIL,MOTRIN) 600 MG tablet Take 1 tablet (600 mg total) by mouth every 8 (eight) hours as needed., Starting Sun 09/25/2017, Print         Note:  This document was prepared using Dragon voice recognition software and may include unintentional dictation errors.     Darel Hong, MD 09/26/17 [phone]

## 2017-09-25 NOTE — Discharge Instructions (Signed)
Please take 600 mg of ibuprofen up to 3 times a day as needed for severe pain and follow-up with both your primary care physician and your urologist as needed.  Return to the emergency department sooner for any concerns.  It was a pleasure to take care of you today, and thank you for coming to our emergency department.  If you have any questions or concerns before leaving please ask the nurse to grab me and I'm more than happy to go through your aftercare instructions again.  If you were prescribed any opioid pain medication today such as Norco, Vicodin, Percocet, morphine, hydrocodone, or oxycodone please make sure you do not drive when you are taking this medication as it can alter your ability to drive safely.  If you have any concerns once you are home that you are not improving or are in fact getting worse before you can make it to your follow-up appointment, please do not hesitate to call 911 and come back for further evaluation.  Darel Hong, MD  Results for orders placed or performed during the hospital encounter of 09/25/17  Comprehensive metabolic panel  Result Value Ref Range   Sodium 135 135 - 145 mmol/L   Potassium 4.0 3.5 - 5.1 mmol/L   Chloride 103 101 - 111 mmol/L   CO2 23 22 - 32 mmol/L   Glucose, Bld 173 (H) 65 - 99 mg/dL   BUN 13 6 - 20 mg/dL   Creatinine, Ser 1.34 (H) 0.44 - 1.00 mg/dL   Calcium 9.2 8.9 - 10.3 mg/dL   Total Protein 7.3 6.5 - 8.1 g/dL   Albumin 4.0 3.5 - 5.0 g/dL   AST 21 15 - 41 U/L   ALT 25 14 - 54 U/L   Alkaline Phosphatase 87 38 - 126 U/L   Total Bilirubin 1.1 0.3 - 1.2 mg/dL   GFR calc non Af Amer 46 (L) >60 mL/min   GFR calc Af Amer 53 (L) >60 mL/min   Anion gap 9 5 - 15  CBC  Result Value Ref Range   WBC 10.2 3.6 - 11.0 K/uL   RBC 4.94 3.80 - 5.20 MIL/uL   Hemoglobin 14.5 12.0 - 16.0 g/dL   HCT 42.7 35.0 - 47.0 %   MCV 86.4 80.0 - 100.0 fL   MCH 29.3 26.0 - 34.0 pg   MCHC 33.9 32.0 - 36.0 g/dL   RDW 13.8 11.5 - 14.5 %   Platelets  204 150 - 440 K/uL  Urinalysis, Complete w Microscopic  Result Value Ref Range   Color, Urine YELLOW (A) YELLOW   APPearance CLEAR (A) CLEAR   Specific Gravity, Urine 1.014 1.005 - 1.030   pH 5.0 5.0 - 8.0   Glucose, UA 50 (A) NEGATIVE mg/dL   Hgb urine dipstick SMALL (A) NEGATIVE   Bilirubin Urine NEGATIVE NEGATIVE   Ketones, ur 20 (A) NEGATIVE mg/dL   Protein, ur NEGATIVE NEGATIVE mg/dL   Nitrite NEGATIVE NEGATIVE   Leukocytes, UA TRACE (A) NEGATIVE   RBC / HPF 0-5 0 - 5 RBC/hpf   WBC, UA 6-30 0 - 5 WBC/hpf   Bacteria, UA NONE SEEN NONE SEEN   Squamous Epithelial / LPF 0-5 (A) NONE SEEN   Mucus PRESENT    Dg Abdomen 1 View  Result Date: 09/07/2017 CLINICAL DATA:  Acute onset of left flank pain. Assess known left ureteral stone. Nausea and vomiting. EXAM: ABDOMEN - 1 VIEW COMPARISON:  CT of the abdomen and pelvis performed earlier today  at 7:28 p.m. FINDINGS: The left ureteral stone noted on CT appears to have migrated distally to the distal left ureter, perhaps 6 cm above the left vesicoureteral junction. Nonobstructing left renal stones are again seen. The visualized bowel gas pattern is grossly unremarkable. No acute osseous abnormalities are identified. IMPRESSION: Left ureteral stone noted on CT appears to have migrated distally to the distal left ureter, perhaps 6 cm above the left vesicoureteral junction. Electronically Signed   By: Garald Balding M.D.   On: 09/07/2017 21:42   US Renal  Result Date: 09/16/2017 CLINICAL DATA:  Flank pain. EXAM: RENAL / URINARY TRACT ULTRASOUND COMPLETE COMPARISON:  None. FINDINGS: Right Kidney: Length: 10.2 cm. Echogenicity within normal limits. No mass or hydronephrosis visualized. Left Kidney: Length: 12.5 cm. Echogenicity within normal limits. Shadowing calculi is seen within the upper pole of the left kidney, the largest measuring 7.5 mm. No mass or hydronephrosis visualized. Bladder: Appears normal for degree of bladder distention. IMPRESSION:  Left nephrolithiasis without evidence of obstructive uropathy. Electronically Signed   By: Fidela Salisbury M.D.   On: 09/16/2017 15:37   Ct Renal Stone Study  Result Date: 09/07/2017 CLINICAL DATA:  Kidney stones. EXAM: CT ABDOMEN AND PELVIS WITHOUT CONTRAST TECHNIQUE: Multidetector CT imaging of the abdomen and pelvis was performed following the standard protocol without IV contrast. COMPARISON:  None. FINDINGS: Lower chest: Lung bases are clear. Hepatobiliary: No focal hepatic lesion. No biliary duct dilatation. Gallbladder is normal. Common bile duct is normal. Pancreas: Pancreas is normal. No ductal dilatation. No pancreatic inflammation. Spleen: Normal spleen Adrenals/urinary tract: Adrenal glands normal. Obstructing calculus in the mid LEFT ureter measures 3 mm (image 51, series 2. Mild hydroureter and hydronephrosis on the LEFT. This calculus is at the L4-L3 vertebral body level. There are 3 additional calculi within LEFT kidney ranging size from 2-7 mm. No RIGHT nephrolithiasis or ureterolithiasis.  No bladder calculi. Stomach/Bowel: Stomach, small bowel, appendix, and cecum are normal. The colon and rectosigmoid colon are normal. Vascular/Lymphatic: Abdominal aorta is normal caliber. No periportal or retroperitoneal adenopathy. No pelvic adenopathy. Reproductive: Post hysterectomy anatomy Other: No free fluid. Musculoskeletal: No aggressive osseous lesion. IMPRESSION: 1. Obstructing calculus in the mid LEFT ureter. Mild to moderate moderate obstructive uropathy on the LEFT. 2. LEFT nephrolithiasis. Electronically Signed   By: Suzy Bouchard M.D.   On: 09/07/2017 19:52

## 2018-06-29 DIAGNOSIS — R739 Hyperglycemia, unspecified: Secondary | ICD-10-CM | POA: Diagnosis not present

## 2018-06-29 DIAGNOSIS — Z79899 Other long term (current) drug therapy: Secondary | ICD-10-CM | POA: Diagnosis not present

## 2018-06-29 DIAGNOSIS — Z1239 Encounter for other screening for malignant neoplasm of breast: Secondary | ICD-10-CM | POA: Diagnosis not present

## 2018-07-04 DIAGNOSIS — R079 Chest pain, unspecified: Secondary | ICD-10-CM | POA: Diagnosis not present

## 2018-07-04 DIAGNOSIS — E1165 Type 2 diabetes mellitus with hyperglycemia: Secondary | ICD-10-CM | POA: Diagnosis not present

## 2018-07-04 DIAGNOSIS — R0789 Other chest pain: Secondary | ICD-10-CM | POA: Diagnosis not present

## 2018-07-04 DIAGNOSIS — R61 Generalized hyperhidrosis: Secondary | ICD-10-CM | POA: Diagnosis not present

## 2018-07-04 DIAGNOSIS — R0602 Shortness of breath: Secondary | ICD-10-CM | POA: Diagnosis not present

## 2018-07-14 DIAGNOSIS — Z1231 Encounter for screening mammogram for malignant neoplasm of breast: Secondary | ICD-10-CM | POA: Diagnosis not present

## 2018-07-26 DIAGNOSIS — I10 Essential (primary) hypertension: Secondary | ICD-10-CM | POA: Diagnosis not present

## 2018-07-26 DIAGNOSIS — E782 Mixed hyperlipidemia: Secondary | ICD-10-CM | POA: Diagnosis not present

## 2018-07-26 DIAGNOSIS — R079 Chest pain, unspecified: Secondary | ICD-10-CM | POA: Diagnosis not present

## 2018-08-09 DIAGNOSIS — R079 Chest pain, unspecified: Secondary | ICD-10-CM | POA: Diagnosis not present

## 2018-08-16 DIAGNOSIS — E782 Mixed hyperlipidemia: Secondary | ICD-10-CM | POA: Diagnosis not present

## 2018-08-16 DIAGNOSIS — R079 Chest pain, unspecified: Secondary | ICD-10-CM | POA: Diagnosis not present

## 2018-08-16 DIAGNOSIS — I10 Essential (primary) hypertension: Secondary | ICD-10-CM | POA: Diagnosis not present

## 2018-11-08 DIAGNOSIS — I10 Essential (primary) hypertension: Secondary | ICD-10-CM | POA: Diagnosis not present

## 2018-11-08 DIAGNOSIS — R609 Edema, unspecified: Secondary | ICD-10-CM | POA: Diagnosis not present

## 2018-11-08 DIAGNOSIS — E782 Mixed hyperlipidemia: Secondary | ICD-10-CM | POA: Diagnosis not present

## 2019-04-24 IMAGING — CT CT RENAL STONE PROTOCOL
2 of 4 series · 16 of 46 positions shown, 18 images · non-contrast
Comparison: None.

CLINICAL DATA: Kidney stones.

EXAM:
CT ABDOMEN AND PELVIS WITHOUT CONTRAST
TECHNIQUE: Multidetector CT imaging of the abdomen and pelvis was performed
following the standard protocol without IV contrast.

[Series 2: stone full standard · axial · 0.83mm/px · z∈[-1048,-588]mm · 13 of 102 slices shown, 15 images]
[im 5/102  soft-tissue]
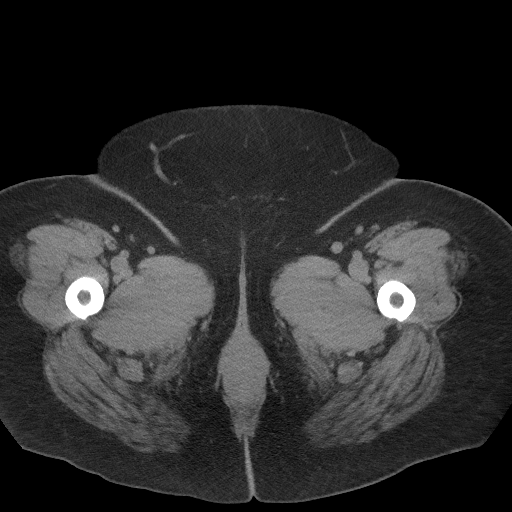
[im 5/102  bone]
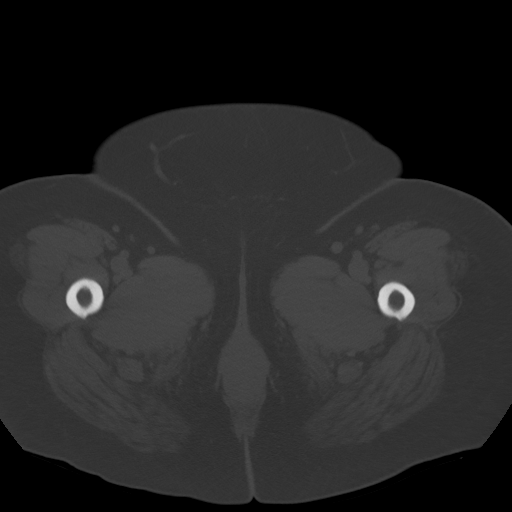
[im 13/102  soft-tissue]
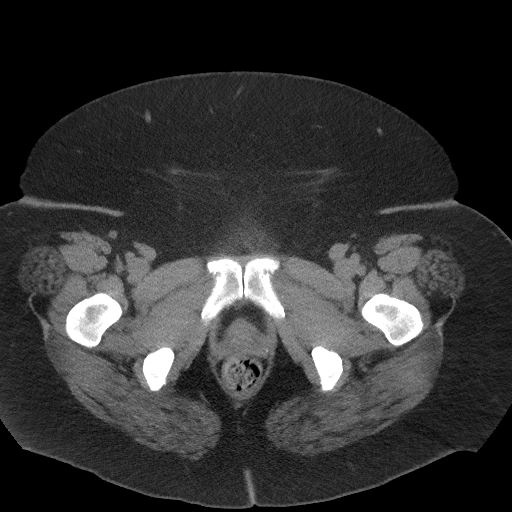
[im 21/102  soft-tissue]
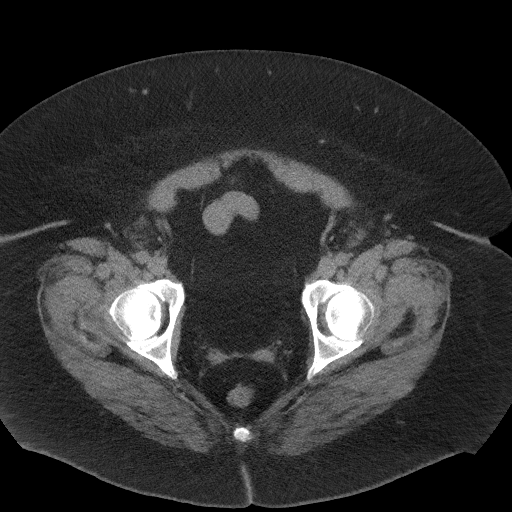
[im 29/102  soft-tissue]
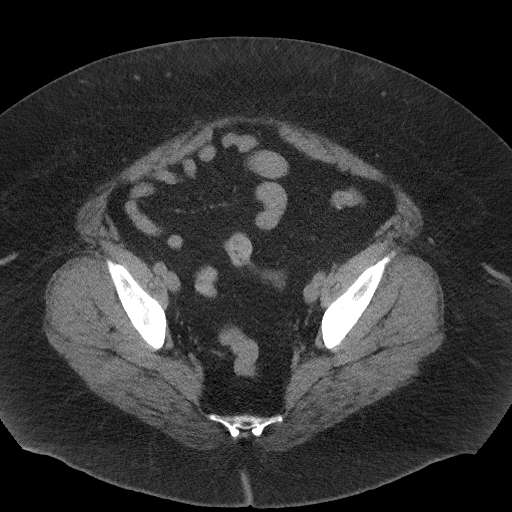
[im 37/102  soft-tissue]
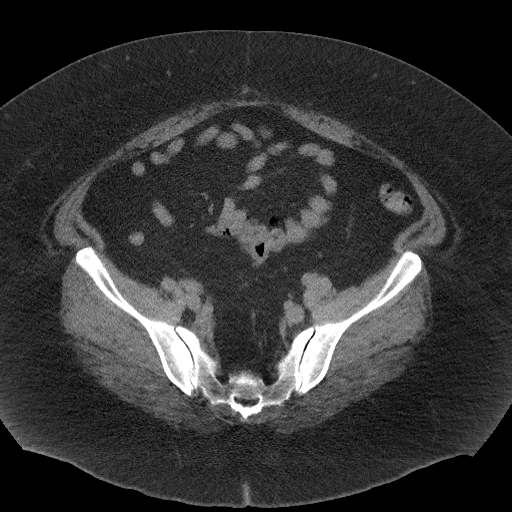
[im 45/102  soft-tissue]
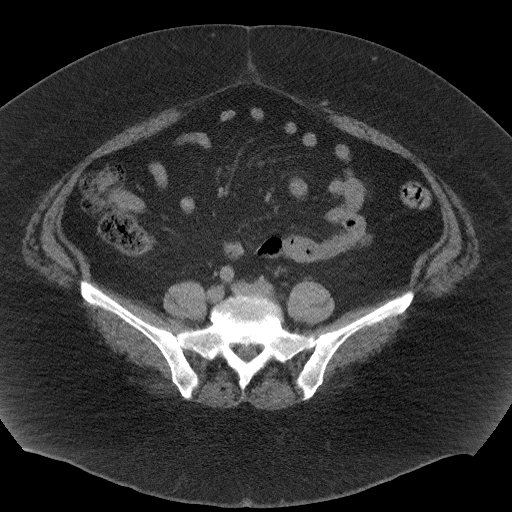
[im 53/102  soft-tissue]
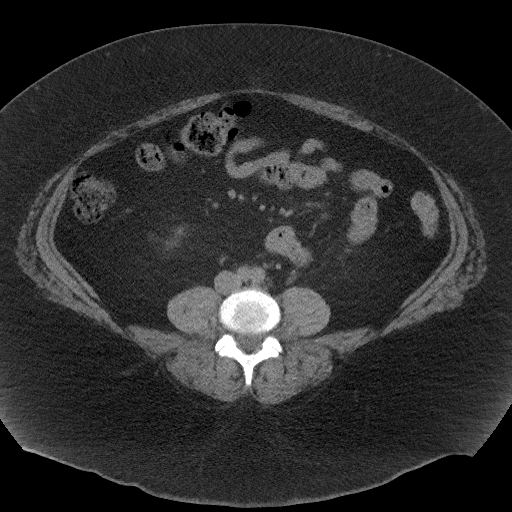
[im 57/102  soft-tissue]
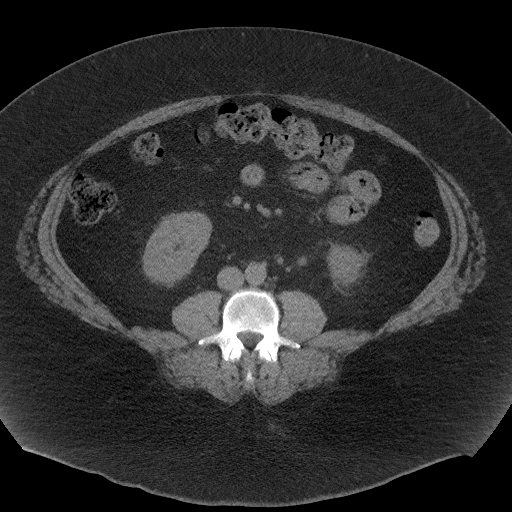
[im 65/102  soft-tissue]
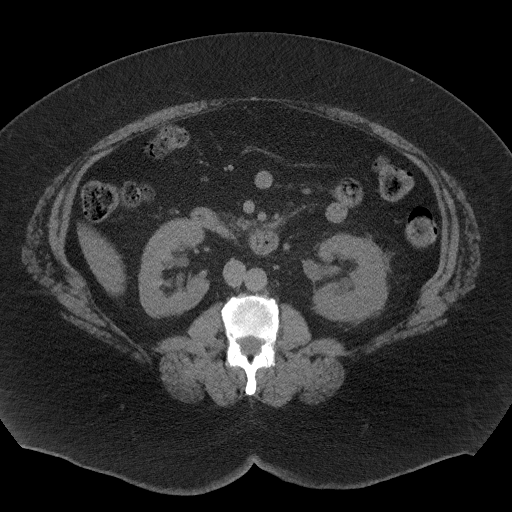
[im 65/102  bone]
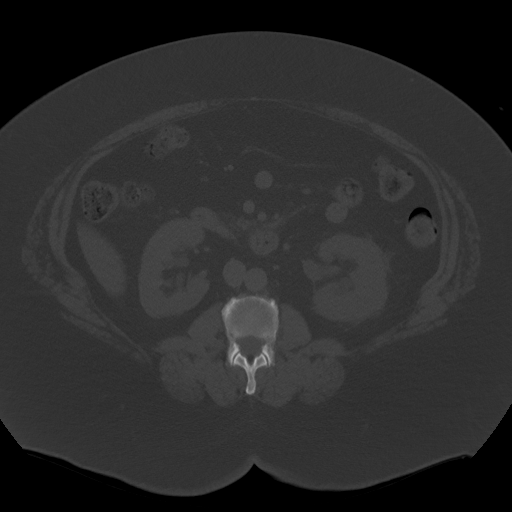
[im 73/102  soft-tissue]
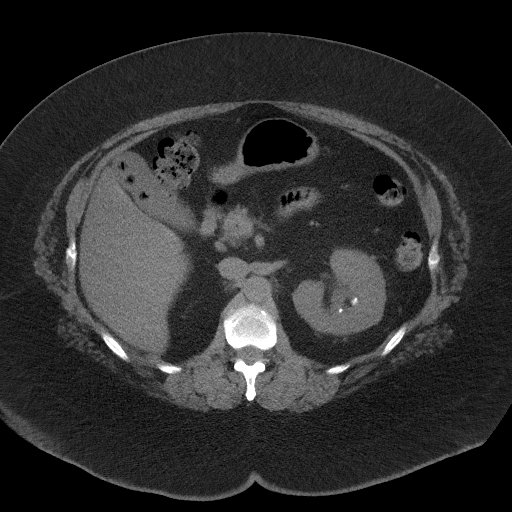
[im 81/102  soft-tissue]
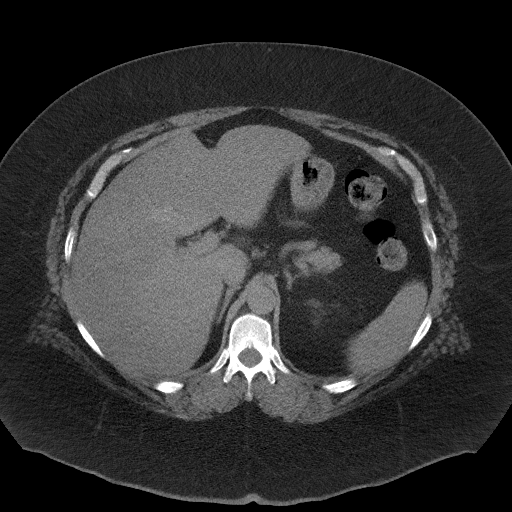
[im 89/102  soft-tissue]
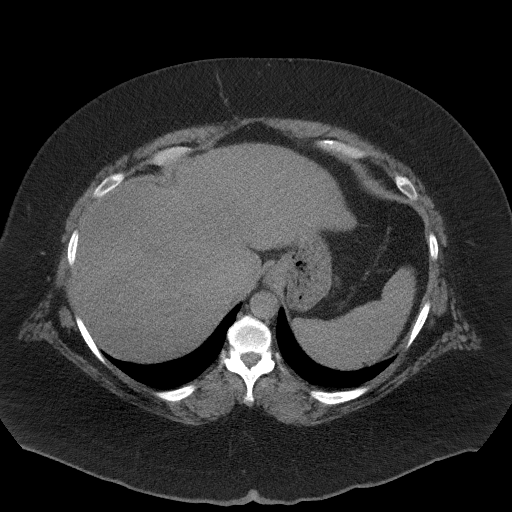
[im 97/102  soft-tissue]
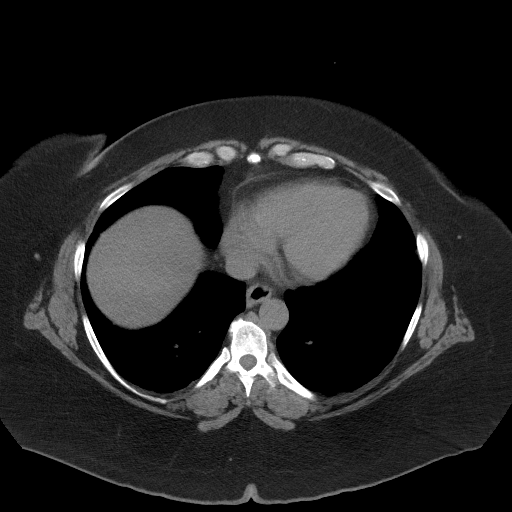

[Series 5: coronal · coronal · 0.86mm/px · 3 of 154 slices shown]
[im 52/154  soft-tissue]
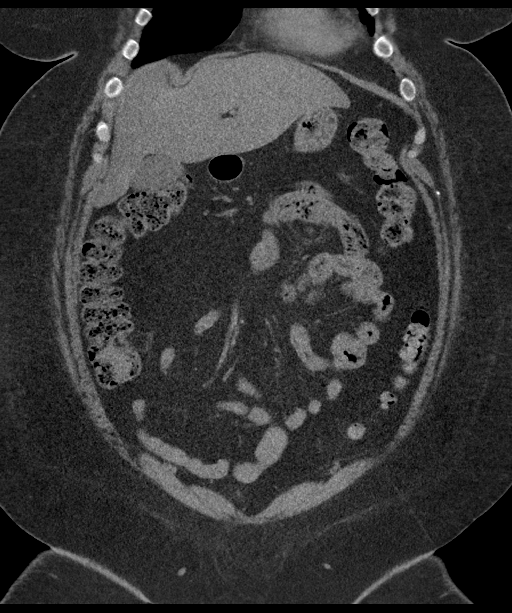
[im 69/154  soft-tissue]
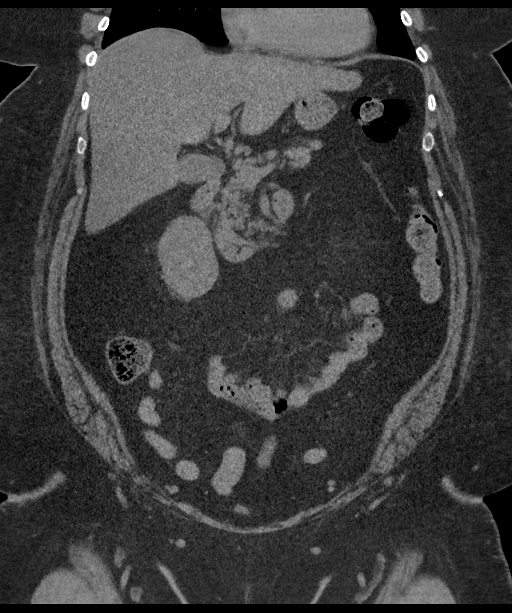
[im 86/154  soft-tissue]
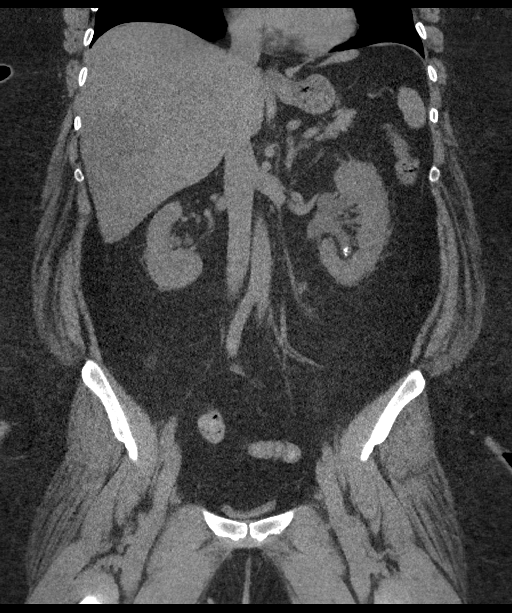

[16 of 46 positions shown; findings below may reference images not displayed]

FINDINGS: Lower chest: Lung bases are clear.

Hepatobiliary: No focal hepatic lesion. No biliary duct dilatation.
Gallbladder is normal. Common bile duct is normal.

Pancreas: Pancreas is normal. No ductal dilatation. No pancreatic
inflammation.

Spleen: Normal spleen

Adrenals/urinary tract: Adrenal glands normal.

Obstructing calculus in the mid LEFT ureter measures 3 mm (image 51,
series 2. Mild hydroureter and hydronephrosis on the LEFT. This
calculus is at the L4-L3 vertebral body level.

There are 3 additional calculi within LEFT kidney ranging size from
2-7 mm.

No RIGHT nephrolithiasis or ureterolithiasis.  No bladder calculi.

Stomach/Bowel: Stomach, small bowel, appendix, and cecum are normal.
The colon and rectosigmoid colon are normal.

Vascular/Lymphatic: Abdominal aorta is normal caliber. No periportal
or retroperitoneal adenopathy. No pelvic adenopathy.

Reproductive: Post hysterectomy anatomy

Other: No free fluid.

Musculoskeletal: No aggressive osseous lesion.
IMPRESSION: 1. Obstructing calculus in the mid LEFT ureter. Mild to moderate
moderate obstructive uropathy on the LEFT.
2. LEFT nephrolithiasis.

## 2019-04-24 IMAGING — CR DG ABDOMEN 1V
2 series · 2 of 2 positions shown · non-contrast
Comparison: CT of the abdomen and pelvis performed earlier today at
[DATE] p.m.

CLINICAL DATA: Acute onset of left flank pain. Assess known left
ureteral stone. Nausea and vomiting.

EXAM:
ABDOMEN - 1 VIEW

[abdomen kub (1 of 2)]
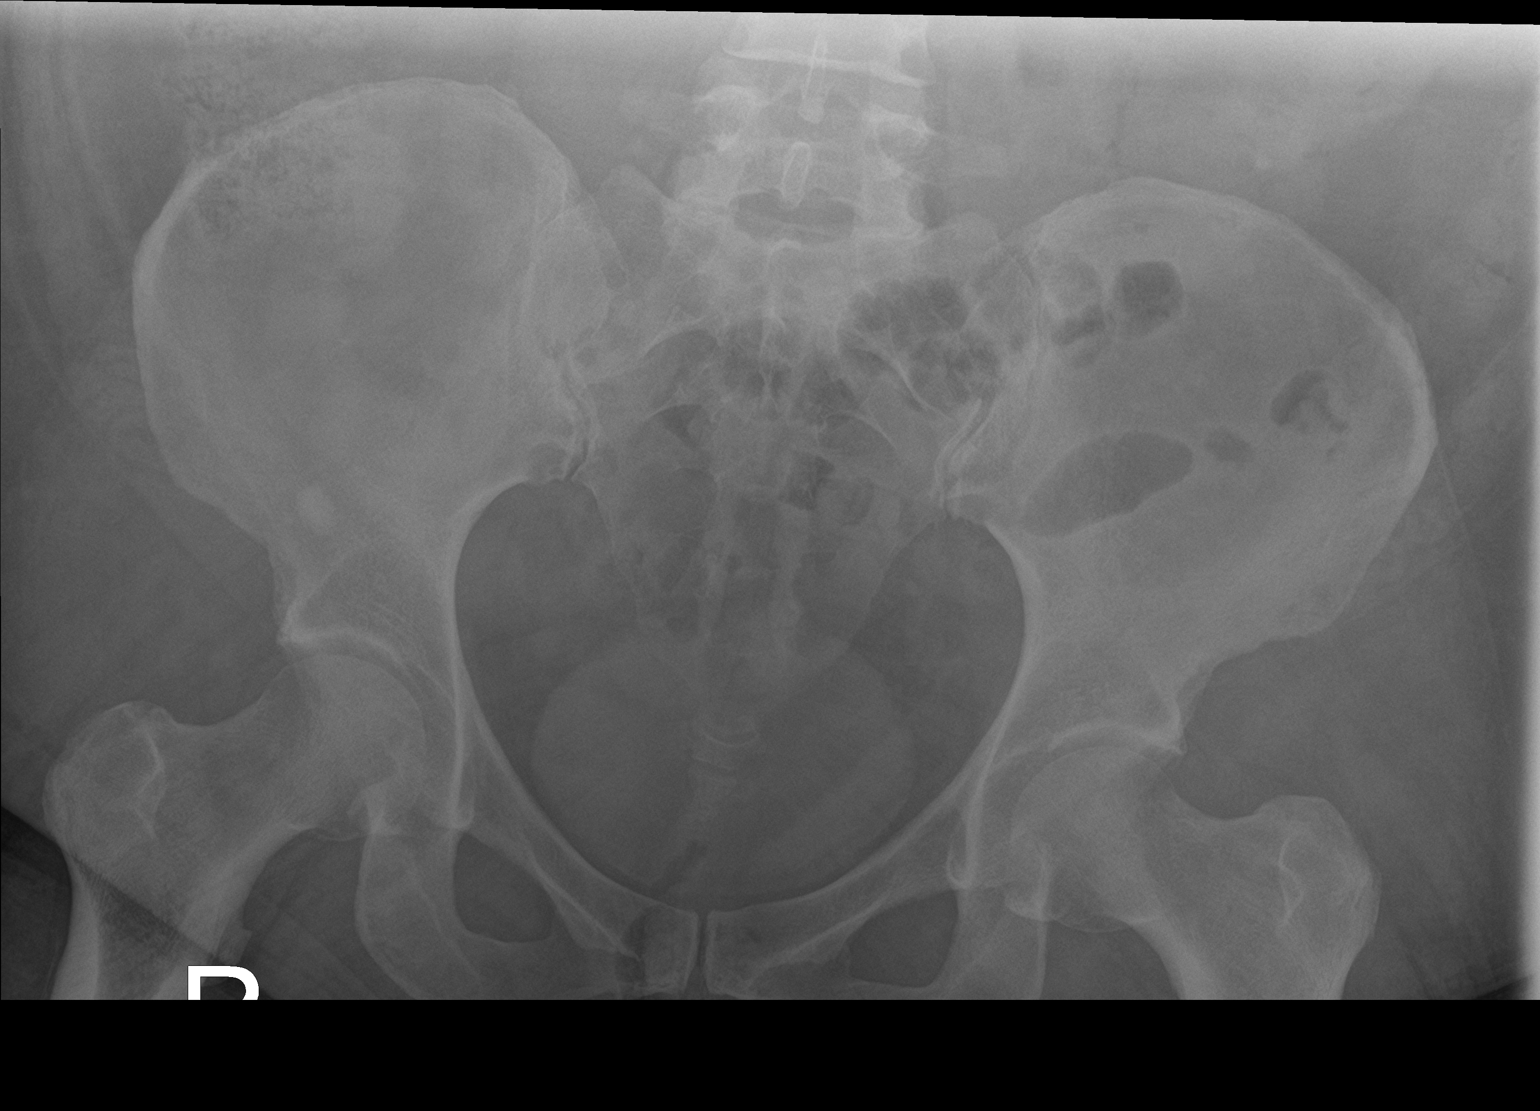

[abdomen kub (2 of 2)]
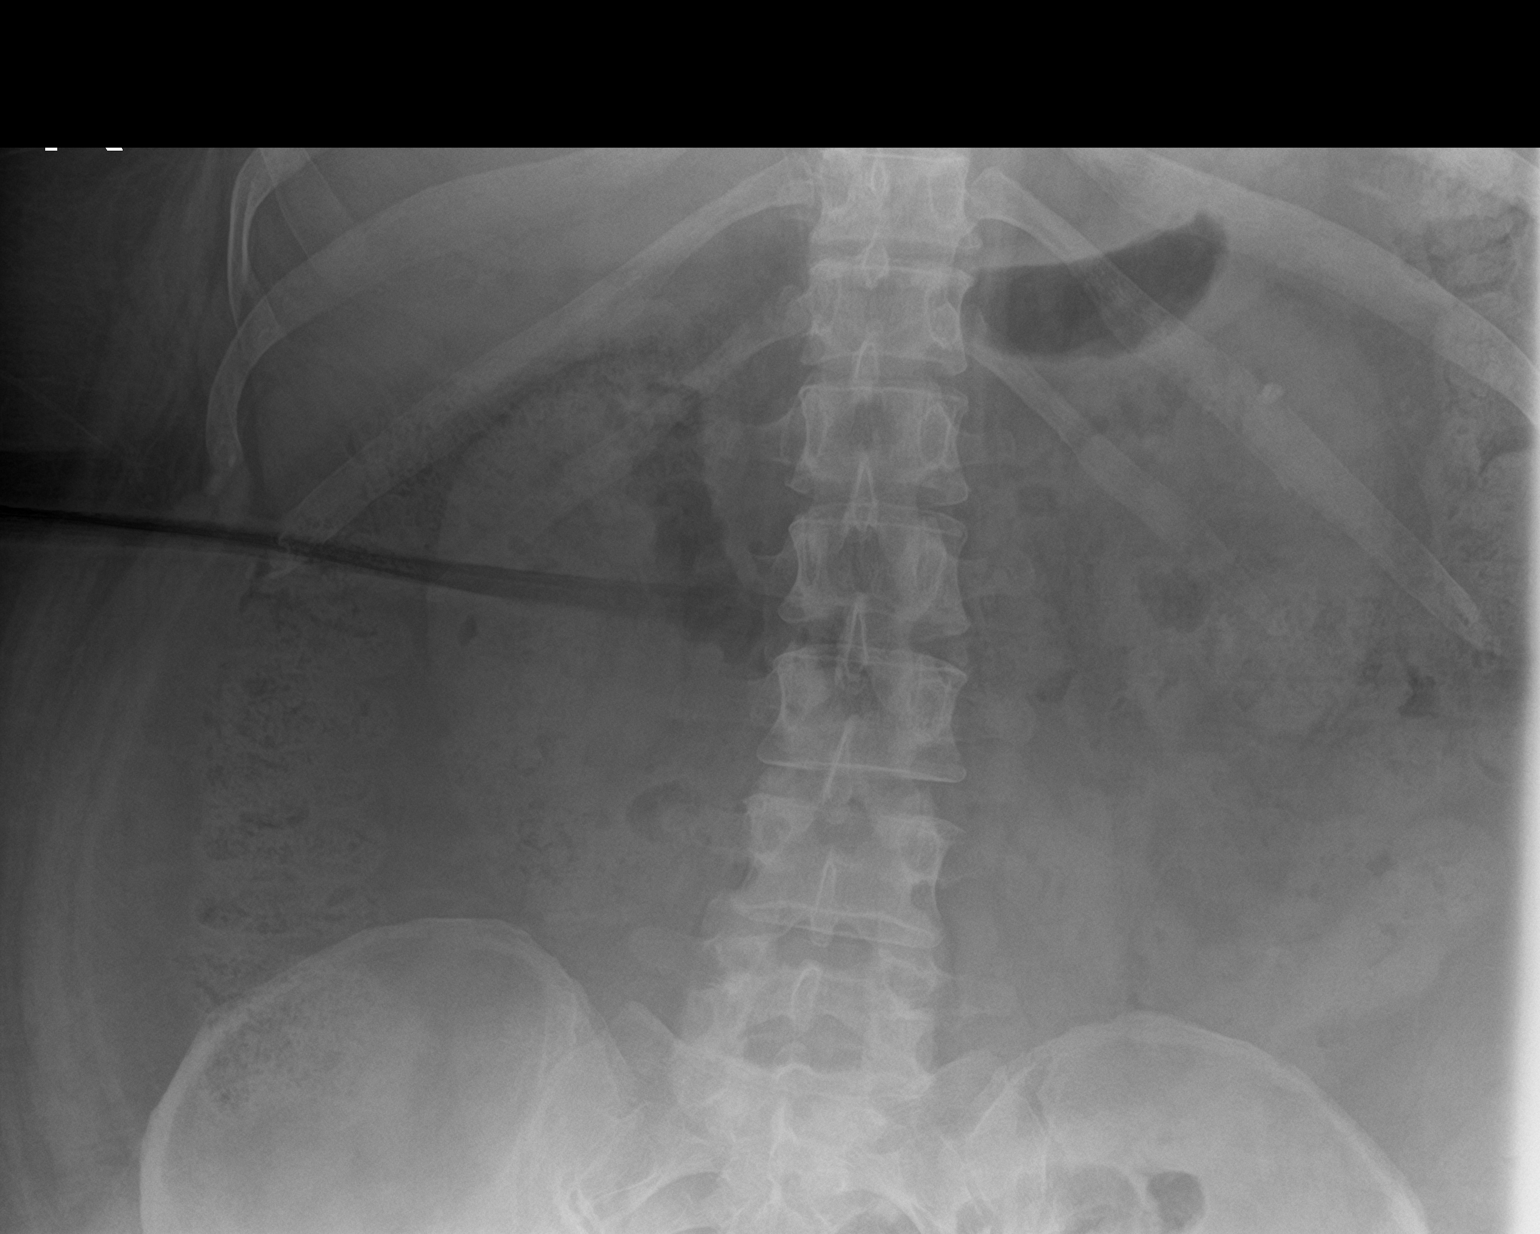

[2 of 2 positions shown; findings below may reference images not displayed]

FINDINGS: The left ureteral stone noted on CT appears to have migrated
distally to the distal left ureter, perhaps 6 cm above the left
vesicoureteral junction.

Nonobstructing left renal stones are again seen. The visualized
bowel gas pattern is grossly unremarkable. No acute osseous
abnormalities are identified.
IMPRESSION: Left ureteral stone noted on CT appears to have migrated distally to
the distal left ureter, perhaps 6 cm above the left vesicoureteral
junction.

## 2019-05-03 ENCOUNTER — Encounter: Payer: Self-pay | Admitting: Emergency Medicine

## 2019-05-03 ENCOUNTER — Other Ambulatory Visit: Payer: Self-pay

## 2019-05-03 ENCOUNTER — Inpatient Hospital Stay
Admission: EM | Admit: 2019-05-03 | Discharge: 2019-05-06 | DRG: 177 | Disposition: A | Discharge status: Home / Self Care | Payer: Commercial Managed Care - PPO | Attending: Family Medicine | Admitting: Family Medicine

## 2019-05-03 ENCOUNTER — Emergency Department: Payer: Commercial Managed Care - PPO

## 2019-05-03 ENCOUNTER — Inpatient Hospital Stay: Payer: Commercial Managed Care - PPO

## 2019-05-03 DIAGNOSIS — J9601 Acute respiratory failure with hypoxia: Secondary | ICD-10-CM | POA: Diagnosis present

## 2019-05-03 DIAGNOSIS — E669 Obesity, unspecified: Secondary | ICD-10-CM

## 2019-05-03 DIAGNOSIS — J069 Acute upper respiratory infection, unspecified: Secondary | ICD-10-CM | POA: Diagnosis not present

## 2019-05-03 DIAGNOSIS — R110 Nausea: Secondary | ICD-10-CM | POA: Diagnosis present

## 2019-05-03 DIAGNOSIS — K810 Acute cholecystitis: Secondary | ICD-10-CM | POA: Diagnosis not present

## 2019-05-03 DIAGNOSIS — L409 Psoriasis, unspecified: Secondary | ICD-10-CM | POA: Diagnosis present

## 2019-05-03 DIAGNOSIS — Z6841 Body Mass Index (BMI) 40.0 and over, adult: Secondary | ICD-10-CM

## 2019-05-03 DIAGNOSIS — R079 Chest pain, unspecified: Secondary | ICD-10-CM | POA: Diagnosis not present

## 2019-05-03 DIAGNOSIS — E118 Type 2 diabetes mellitus with unspecified complications: Secondary | ICD-10-CM | POA: Diagnosis not present

## 2019-05-03 DIAGNOSIS — K8000 Calculus of gallbladder with acute cholecystitis without obstruction: Secondary | ICD-10-CM | POA: Diagnosis present

## 2019-05-03 DIAGNOSIS — R197 Diarrhea, unspecified: Secondary | ICD-10-CM | POA: Diagnosis present

## 2019-05-03 DIAGNOSIS — K8020 Calculus of gallbladder without cholecystitis without obstruction: Secondary | ICD-10-CM | POA: Diagnosis present

## 2019-05-03 DIAGNOSIS — J189 Pneumonia, unspecified organism: Secondary | ICD-10-CM | POA: Diagnosis not present

## 2019-05-03 DIAGNOSIS — Z7984 Long term (current) use of oral hypoglycemic drugs: Secondary | ICD-10-CM | POA: Diagnosis not present

## 2019-05-03 DIAGNOSIS — R1013 Epigastric pain: Secondary | ICD-10-CM

## 2019-05-03 DIAGNOSIS — Z87442 Personal history of urinary calculi: Secondary | ICD-10-CM | POA: Diagnosis not present

## 2019-05-03 DIAGNOSIS — E119 Type 2 diabetes mellitus without complications: Secondary | ICD-10-CM | POA: Diagnosis present

## 2019-05-03 DIAGNOSIS — E785 Hyperlipidemia, unspecified: Secondary | ICD-10-CM | POA: Diagnosis present

## 2019-05-03 DIAGNOSIS — Z791 Long term (current) use of non-steroidal anti-inflammatories (NSAID): Secondary | ICD-10-CM

## 2019-05-03 DIAGNOSIS — Z79899 Other long term (current) drug therapy: Secondary | ICD-10-CM

## 2019-05-03 DIAGNOSIS — J1289 Other viral pneumonia: Secondary | ICD-10-CM | POA: Diagnosis present

## 2019-05-03 DIAGNOSIS — J188 Other pneumonia, unspecified organism: Secondary | ICD-10-CM | POA: Diagnosis present

## 2019-05-03 DIAGNOSIS — U071 COVID-19: Secondary | ICD-10-CM | POA: Diagnosis present

## 2019-05-03 DIAGNOSIS — E1169 Type 2 diabetes mellitus with other specified complication: Secondary | ICD-10-CM

## 2019-05-03 DIAGNOSIS — Z85820 Personal history of malignant melanoma of skin: Secondary | ICD-10-CM

## 2019-05-03 DIAGNOSIS — Z91048 Other nonmedicinal substance allergy status: Secondary | ICD-10-CM

## 2019-05-03 DIAGNOSIS — D72819 Decreased white blood cell count, unspecified: Secondary | ICD-10-CM | POA: Diagnosis not present

## 2019-05-03 DIAGNOSIS — I10 Essential (primary) hypertension: Secondary | ICD-10-CM | POA: Diagnosis present

## 2019-05-03 DIAGNOSIS — Z794 Long term (current) use of insulin: Secondary | ICD-10-CM | POA: Diagnosis not present

## 2019-05-03 DIAGNOSIS — K8080 Other cholelithiasis without obstruction: Secondary | ICD-10-CM | POA: Diagnosis not present

## 2019-05-03 HISTORY — DX: Type 2 diabetes mellitus without complications: E11.9

## 2019-05-03 HISTORY — DX: Malignant (primary) neoplasm, unspecified: C80.1

## 2019-05-03 LAB — CBC WITH DIFFERENTIAL/PLATELET
Abs Immature Granulocytes: 0.01 10*3/uL (ref 0.00–0.07)
Abs Immature Granulocytes: 0.01 10*3/uL (ref 0.00–0.07)
Basophils Absolute: 0 10*3/uL (ref 0.0–0.1)
Basophils Absolute: 0 10*3/uL (ref 0.0–0.1)
Basophils Relative: 1 %
Basophils Relative: 0 %
Eosinophils Absolute: 0.1 10*3/uL (ref 0.0–0.5)
Eosinophils Absolute: 0.1 10*3/uL (ref 0.0–0.5)
Eosinophils Relative: 3 %
Eosinophils Relative: 3 %
HCT: 43.8 % (ref 36.0–46.0)
HCT: 44.5 % (ref 36.0–46.0)
Hemoglobin: 14.3 g/dL (ref 12.0–15.0)
Hemoglobin: 14.3 g/dL (ref 12.0–15.0)
Immature Granulocytes: 0 %
Immature Granulocytes: 0 %
Lymphocytes Relative: 37 %
Lymphocytes Relative: 37 %
Lymphs Abs: 1.5 10*3/uL (ref 0.7–4.0)
Lymphs Abs: 1.3 10*3/uL (ref 0.7–4.0)
MCH: 28.8 pg (ref 26.0–34.0)
MCH: 28.6 pg (ref 26.0–34.0)
MCHC: 32.6 g/dL (ref 30.0–36.0)
MCHC: 32.1 g/dL (ref 30.0–36.0)
MCV: 88.1 fL (ref 80.0–100.0)
MCV: 89 fL (ref 80.0–100.0)
Monocytes Absolute: 0.6 10*3/uL (ref 0.1–1.0)
Monocytes Absolute: 0.4 10*3/uL (ref 0.1–1.0)
Monocytes Relative: 14 %
Monocytes Relative: 12 %
Neutro Abs: 1.8 10*3/uL (ref 1.7–7.7)
Neutro Abs: 1.7 10*3/uL (ref 1.7–7.7)
Neutrophils Relative %: 45 %
Neutrophils Relative %: 48 %
Platelets: 152 10*3/uL (ref 150–400)
Platelets: 156 10*3/uL (ref 150–400)
RBC: 4.97 MIL/uL (ref 3.87–5.11)
RBC: 5 MIL/uL (ref 3.87–5.11)
RDW: 13.6 % (ref 11.5–15.5)
RDW: 13.7 % (ref 11.5–15.5)
WBC: 4.1 10*3/uL (ref 4.0–10.5)
WBC: 3.6 10*3/uL — ABNORMAL LOW (ref 4.0–10.5)
nRBC: 0 % (ref 0.0–0.2)
nRBC: 0 % (ref 0.0–0.2)

## 2019-05-03 LAB — URINALYSIS, COMPLETE (UACMP) WITH MICROSCOPIC
Bilirubin Urine: NEGATIVE
Glucose, UA: >=500 mg/dL — AB
Hgb urine dipstick: NEGATIVE
Ketones, ur: NEGATIVE mg/dL
Leukocytes,Ua: NEGATIVE
Nitrite: NEGATIVE
Protein, ur: NEGATIVE mg/dL
Specific Gravity, Urine: 1.017 (ref 1.005–1.030)
pH: 5 (ref 5.0–8.0)

## 2019-05-03 LAB — LIPASE, BLOOD
Lipase: 27 U/L (ref 11–51)
Lipase: 23 U/L (ref 11–51)

## 2019-05-03 LAB — COMPREHENSIVE METABOLIC PANEL
ALT: 36 U/L (ref 0–44)
ALT: 35 U/L (ref 0–44)
AST: 37 U/L (ref 15–41)
AST: 29 U/L (ref 15–41)
Albumin: 3.7 g/dL (ref 3.5–5.0)
Albumin: 3.9 g/dL (ref 3.5–5.0)
Alkaline Phosphatase: 68 U/L (ref 38–126)
Alkaline Phosphatase: 71 U/L (ref 38–126)
Anion gap: 11 (ref 5–15)
Anion gap: 8 (ref 5–15)
BUN: 11 mg/dL (ref 6–20)
BUN: 9 mg/dL (ref 6–20)
CO2: 20 mmol/L — ABNORMAL LOW (ref 22–32)
CO2: 26 mmol/L (ref 22–32)
Calcium: 9 mg/dL (ref 8.9–10.3)
Calcium: 8.6 mg/dL — ABNORMAL LOW (ref 8.9–10.3)
Chloride: 107 mmol/L (ref 98–111)
Chloride: 105 mmol/L (ref 98–111)
Creatinine, Ser: 0.72 mg/dL (ref 0.44–1.00)
Creatinine, Ser: 0.59 mg/dL (ref 0.44–1.00)
GFR calc Af Amer: >60 mL/min (ref >60)
GFR calc Af Amer: >60 mL/min (ref >60)
GFR calc non Af Amer: >60 mL/min (ref >60)
GFR calc non Af Amer: >60 mL/min (ref >60)
Glucose, Bld: 265 mg/dL — ABNORMAL HIGH (ref 70–99)
Glucose, Bld: 188 mg/dL — ABNORMAL HIGH (ref 70–99)
Potassium: 3.8 mmol/L (ref 3.5–5.1)
Potassium: 3.8 mmol/L (ref 3.5–5.1)
Sodium: 138 mmol/L (ref 135–145)
Sodium: 139 mmol/L (ref 135–145)
Total Bilirubin: 0.6 mg/dL (ref 0.3–1.2)
Total Bilirubin: 0.6 mg/dL (ref 0.3–1.2)
Total Protein: 6.7 g/dL (ref 6.5–8.1)
Total Protein: 7 g/dL (ref 6.5–8.1)

## 2019-05-03 LAB — BRAIN NATRIURETIC PEPTIDE: B Natriuretic Peptide: 16 pg/mL (ref 0.0–100.0)

## 2019-05-03 LAB — TROPONIN I (HIGH SENSITIVITY)
Troponin I (High Sensitivity): 4 ng/L (ref <18)
Troponin I (High Sensitivity): 3 ng/L (ref <18)

## 2019-05-03 LAB — SARS CORONAVIRUS 2 (TAT 6-24 HRS): SARS Coronavirus 2: POSITIVE — AB

## 2019-05-03 LAB — STREP PNEUMONIAE URINARY ANTIGEN: Strep Pneumo Urinary Antigen: NEGATIVE

## 2019-05-03 LAB — HIV ANTIBODY (ROUTINE TESTING W REFLEX): HIV Screen 4th Generation wRfx: NONREACTIVE

## 2019-05-03 LAB — GLUCOSE, CAPILLARY
Glucose-Capillary: 181 mg/dL — ABNORMAL HIGH (ref 70–99)
Glucose-Capillary: 127 mg/dL — ABNORMAL HIGH (ref 70–99)

## 2019-05-03 MED ORDER — ONDANSETRON HCL 4 MG/2ML IJ SOLN
4.0000 mg | Freq: Once | INTRAMUSCULAR | Status: AC
  Administered 2019-05-03: 4 mg via INTRAVENOUS

## 2019-05-03 MED ORDER — SODIUM CHLORIDE 0.9 % IV SOLN
500.0000 mg | INTRAVENOUS | Status: DC
  Administered 2019-05-03: 500 mg via INTRAVENOUS
  Filled 2019-05-03 (×2): qty 500

## 2019-05-03 MED ORDER — IOHEXOL 350 MG/ML SOLN
125.0000 mL | Freq: Once | INTRAVENOUS | Status: AC | PRN
  Administered 2019-05-03: 125 mL via INTRAVENOUS

## 2019-05-03 MED ORDER — ACETAMINOPHEN 650 MG RE SUPP: 650.0000 mg | Freq: Four times a day (QID) | RECTAL | Status: DC | PRN

## 2019-05-03 MED ORDER — SODIUM CHLORIDE 0.9 % IV SOLN
2.0000 g | INTRAVENOUS | Status: DC
  Administered 2019-05-04: 2 g via INTRAVENOUS
  Filled 2019-05-03 (×2): qty 20

## 2019-05-03 MED ORDER — ONDANSETRON HCL 4 MG/2ML IJ SOLN
4.0000 mg | Freq: Four times a day (QID) | INTRAMUSCULAR | Status: DC | PRN
  Administered 2019-05-05: 4 mg via INTRAVENOUS
  Filled 2019-05-03: qty 2

## 2019-05-03 MED ORDER — ONDANSETRON HCL 4 MG/2ML IJ SOLN
INTRAMUSCULAR | Status: AC
  Filled 2019-05-03: qty 2

## 2019-05-03 MED ORDER — ENOXAPARIN SODIUM 40 MG/0.4ML ~~LOC~~ SOLN
40.0000 mg | Freq: Two times a day (BID) | SUBCUTANEOUS | Status: DC
  Administered 2019-05-04 – 2019-05-06 (×5): 40 mg via SUBCUTANEOUS
  Filled 2019-05-03 (×6): qty 0.4

## 2019-05-03 MED ORDER — MORPHINE SULFATE (PF) 4 MG/ML IV SOLN
4.0000 mg | Freq: Once | INTRAVENOUS | Status: AC
  Administered 2019-05-03: 4 mg via INTRAVENOUS

## 2019-05-03 MED ORDER — METHYLPREDNISOLONE SODIUM SUCC 125 MG IJ SOLR
125.0000 mg | Freq: Four times a day (QID) | INTRAMUSCULAR | Status: DC
  Administered 2019-05-03 – 2019-05-04 (×3): 125 mg via INTRAVENOUS
  Filled 2019-05-03 (×3): qty 2

## 2019-05-03 MED ORDER — SODIUM CHLORIDE 0.9 % IV BOLUS: 500.0000 mL | Freq: Once | INTRAVENOUS | Status: DC

## 2019-05-03 MED ORDER — MORPHINE SULFATE (PF) 4 MG/ML IV SOLN
INTRAVENOUS | Status: AC
  Filled 2019-05-03: qty 1

## 2019-05-03 MED ORDER — INSULIN ASPART 100 UNIT/ML ~~LOC~~ SOLN: 0.0000 [IU] | Freq: Three times a day (TID) | SUBCUTANEOUS | Status: DC

## 2019-05-03 MED ORDER — HYDROMORPHONE HCL 1 MG/ML IJ SOLN
1.0000 mg | Freq: Once | INTRAMUSCULAR | Status: AC
  Administered 2019-05-03: 1 mg via INTRAVENOUS
  Filled 2019-05-03: qty 1

## 2019-05-03 MED ORDER — ACETAMINOPHEN 325 MG PO TABS
650.0000 mg | ORAL_TABLET | Freq: Four times a day (QID) | ORAL | Status: DC | PRN
  Administered 2019-05-03 – 2019-05-05 (×5): 650 mg via ORAL
  Filled 2019-05-03 (×5): qty 2

## 2019-05-03 MED ORDER — ATORVASTATIN CALCIUM 10 MG PO TABS
10.0000 mg | ORAL_TABLET | Freq: Every day | ORAL | Status: DC
  Administered 2019-05-03 – 2019-05-06 (×4): 10 mg via ORAL
  Filled 2019-05-03 (×4): qty 1

## 2019-05-03 MED ORDER — SODIUM CHLORIDE 0.9 % IV SOLN
500.0000 mg | Freq: Once | INTRAVENOUS | Status: AC
  Administered 2019-05-03: 500 mg via INTRAVENOUS
  Filled 2019-05-03: qty 500

## 2019-05-03 MED ORDER — ONDANSETRON HCL 4 MG PO TABS: 4.0000 mg | ORAL_TABLET | Freq: Four times a day (QID) | ORAL | Status: DC | PRN

## 2019-05-03 MED ORDER — SODIUM CHLORIDE 0.9 % IV SOLN
1.0000 g | Freq: Once | INTRAVENOUS | Status: AC
  Administered 2019-05-03: 1 g via INTRAVENOUS
  Filled 2019-05-03: qty 10

## 2019-05-03 NOTE — H&P (Signed)
History and Physical    Grace Flores [id number] DOB: 1966-03-27 DOA: 05/03/2019  PCP: Idelle Crouch, MD  Patient coming from: Home.  Chief Complaint: Chest pain and shortness of breath.  HPI: Grace Flores is a 53 y.o. female with history of diabetes mellitus type 2, hyperlipidemia nephrolithiasis presents to the ER with complaints of having chest pain and shortness of breath with nonproductive cough ongoing for last 3 days.  Patient also having nausea and couple of episodes of diarrhea.  Denies vomiting.  Chest pain is diffusely across the chest radiating to back and has been increasing in intensity over the last couple of days.  Denies any COVID-19 contacts.  Denies any recent travel.  ED Course: In the ER patient was hypoxic requiring 4 L oxygen.  CT angiogram of the chest and CT abdomen was done.  Shows features concerning for multifocal pneumonia and gallstones.  Also shows features concerning for pulmonary hypertension with some pleural effusion.  Patient had blood cultures drawn and started on empiric antibiotic for community-acquired pneumonia.  COVID-19 test has been done which is pending.  ER physician started patient on Decadron 1 dose for now concerning for COVID-19.  Patient's labs show normal LFTs and on exam abdomen appears benign.  High-sensitivity troponin is negative EKG shows normal sinus rhythm.  Patient admitted for acute respiratory failure with hypoxia likely from multifocal pneumonia with nausea and gallstones.  Review of Systems: As per HPI, rest all negative.   Past Medical History:  Diagnosis Date  . Cancer ([place])    melanoma left shoulder, basal cell nose  . Diabetes mellitus without complication ([place])   . Hyperlipidemia   . Kidney stones     Past Surgical History:  Procedure Laterality Date  . ABDOMINAL HYSTERECTOMY    . EXTRACORPOREAL SHOCK WAVE LITHOTRIPSY Left 09/22/2017   Procedure: EXTRACORPOREAL SHOCK WAVE LITHOTRIPSY (ESWL);  Surgeon:  [name], MD;  Location: [facility] ORS;  Service: Urology;  Laterality: Left;  . LAPAROSCOPIC ENDOMETRIOSIS FULGURATION       reports that she has never smoked. She has never used smokeless tobacco. She reports current alcohol use. She reports that she does not use drugs.  Allergies  Allergen Reactions  . Tape Other (See Comments)    Blistering skin and skin comes off    Family History  Family history unknown: Yes    Prior to Admission medications   Medication Sig Start Date End Date Taking? Authorizing Provider  atorvastatin (LIPITOR) 10 MG tablet Take 1 tablet by mouth daily. 07/18/17  Yes [provider]  glimepiride (AMARYL) 4 MG tablet Take 4 mg by mouth 2 (two) times daily.    Yes [provider]  meloxicam (MOBIC) 7.5 MG tablet Take 7.5 mg by mouth daily. 04/18/19  Yes [provider]  pioglitazone (ACTOS) 30 MG tablet Take 30 mg by mouth daily. 04/27/19  Yes [provider]    Physical Exam: Constitutional: Moderately built and nourished. Vitals:   05/03/19 0500 05/03/19 0530 05/03/19 0613 05/03/19 0615  BP: 140/88 102/61 125/84   Pulse:    80  Resp:      Temp:      TempSrc:      SpO2:    95%  Weight:      Height:       Eyes: Anicteric no pallor. ENMT: No discharge from the ears eyes nose or mouth. Neck: No mass felt.  No neck rigidity. Respiratory: No rhonchi or crepitations. Cardiovascular:  S1-S2 heard. Abdomen: Soft nontender bowel sounds present. Musculoskeletal: No edema. Skin: No rash. Neurologic: Alert awake oriented to time place and person.  Moves all extremities. Psychiatric: Appears normal with normal affect.   Labs on Admission: I have personally reviewed following labs and imaging studies  CBC: Recent Labs  Lab 05/03/19 0214  WBC 4.1  NEUTROABS 1.8  HGB 14.3  HCT 43.8  MCV 88.1  PLT 0000000   Basic Metabolic Panel: Recent Labs  Lab 05/03/19 0214  NA 138  K 3.8  CL 107  CO2 20*  GLUCOSE 265*   BUN 11  CREATININE 0.72  CALCIUM 9.0   GFR: Estimated Creatinine Clearance: 124.1 mL/min (by C-G formula based on SCr of 0.72 mg/dL). Liver Function Tests: Recent Labs  Lab 05/03/19 0214  AST 37  ALT 36  ALKPHOS 68  BILITOT 0.6  PROT 6.7  ALBUMIN 3.7   Recent Labs  Lab 05/03/19 0214  LIPASE 27   No results for input(s): AMMONIA in the last 168 hours. Coagulation Profile: No results for input(s): INR, PROTIME in the last 168 hours. Cardiac Enzymes: No results for input(s): CKTOTAL, CKMB, CKMBINDEX, TROPONINI in the last 168 hours. BNP (last 3 results) No results for input(s): PROBNP in the last 8760 hours. HbA1C: No results for input(s): HGBA1C in the last 72 hours. CBG: No results for input(s): GLUCAP in the last 168 hours. Lipid Profile: No results for input(s): CHOL, HDL, LDLCALC, TRIG, CHOLHDL, LDLDIRECT in the last 72 hours. Thyroid Function Tests: No results for input(s): TSH, T4TOTAL, FREET4, T3FREE, THYROIDAB in the last 72 hours. Anemia Panel: No results for input(s): VITAMINB12, FOLATE, FERRITIN, TIBC, IRON, RETICCTPCT in the last 72 hours. Urine analysis:    Component Value Date/Time   COLORURINE YELLOW (A) 05/03/2019 0214   APPEARANCEUR CLEAR (A) 05/03/2019 0214   LABSPEC 1.017 05/03/2019 0214   PHURINE 5.0 05/03/2019 0214   GLUCOSEU >=500 (A) 05/03/2019 0214   HGBUR NEGATIVE 05/03/2019 0214   BILIRUBINUR NEGATIVE 05/03/2019 0214   KETONESUR NEGATIVE 05/03/2019 0214   PROTEINUR NEGATIVE 05/03/2019 0214   NITRITE NEGATIVE 05/03/2019 0214   LEUKOCYTESUR NEGATIVE 05/03/2019 0214   Sepsis Labs: @LABRCNTIP (procalcitonin:4,lacticidven:4) )No results found for this or any previous visit (from the past 240 hour(s)).   Radiological Exams on Admission: Ct Angio Chest Pe W And/or Wo Contrast  Result Date: 05/03/2019 CLINICAL DATA:  Acute abdominal pain. Epigastric pain. Complex chest pain EXAM: CT ANGIOGRAPHY CHEST CT ABDOMEN AND PELVIS WITH CONTRAST  TECHNIQUE: Multidetector CT imaging of the chest was performed using the standard protocol during bolus administration of intravenous contrast. Multiplanar CT image reconstructions and MIPs were obtained to evaluate the vascular anatomy. Multidetector CT imaging of the abdomen and pelvis was performed using the standard protocol during bolus administration of intravenous contrast. CONTRAST:  162mL OMNIPAQUE IOHEXOL 350 MG/ML SOLN COMPARISON:  None. FINDINGS: CTA CHEST FINDINGS Cardiovascular: Contrast injection is sufficient to demonstrate satisfactory opacification of the pulmonary arteries to the segmental level. There is no pulmonary embolus. The main pulmonary artery is dilated measuring approximately 3.6 cm. There is no CT evidence of acute right heart strain. The visualized aorta is normal. Heart size is normal, without pericardial effusion. Mediastinum/Nodes: --No mediastinal or hilar lymphadenopathy. --No axillary lymphadenopathy. --No supraclavicular lymphadenopathy. --Normal thyroid gland. --The esophagus is unremarkable Lungs/Pleura: There are multifocal airspace opacities bilaterally. The lung volumes are low. There is no pneumothorax. There are trace bilateral pleural effusions. Musculoskeletal: No chest wall abnormality. No acute or significant osseous findings.  Review of the MIP images confirms the above findings. CT ABDOMEN and PELVIS FINDINGS Hepatobiliary: There is decreased hepatic attenuation suggestive of hepatic steatosis. Cholelithiasis without acute inflammation.There is no biliary ductal dilation. Pancreas: Normal contours without ductal dilatation. No peripancreatic fluid collection. Spleen: No splenic laceration or hematoma. Adrenals/Urinary Tract: --Adrenal glands: No adrenal hemorrhage. --Right kidney/ureter: No hydronephrosis or perinephric hematoma. --Left kidney/ureter: There is a nonobstructing stone in the interpolar region of the left kidney. There is a small nonobstructing stone  in the lower pole. --Urinary bladder: Unremarkable. Stomach/Bowel: --Stomach/Duodenum: No hiatal hernia or other gastric abnormality. Normal duodenal course and caliber. --Small bowel: No dilatation or inflammation. --Colon: No focal abnormality. --Appendix: Normal. Vascular/Lymphatic: Normal course and caliber of the major abdominal vessels. --No retroperitoneal lymphadenopathy. --No mesenteric lymphadenopathy. --No pelvic or inguinal lymphadenopathy. Reproductive: Status post hysterectomy. No adnexal mass. Other: No ascites or free air. The abdominal wall is normal. Musculoskeletal. No acute displaced fractures. Review of the MIP images confirms the above findings. IMPRESSION: 1. No acute aortic syndrome or pulmonary embolus. 2. Bilateral multifocal airspace opacities, concerning for multifocal pneumonia (viral or bacterial). 3. Trace bilateral pleural effusions. 4. Dilated main pulmonary artery, suggestive of pulmonary arterial hypertension. 5. Cholelithiasis without acute inflammation. 6. Nonobstructive left nephrolithiasis. 7. Hepatic steatosis. Electronically Signed   By: Constance Holster M.D.   On: 05/03/2019 04:21   Ct Abdomen Pelvis W Contrast  Result Date: 05/03/2019 CLINICAL DATA:  Acute abdominal pain. Epigastric pain. Complex chest pain EXAM: CT ANGIOGRAPHY CHEST CT ABDOMEN AND PELVIS WITH CONTRAST TECHNIQUE: Multidetector CT imaging of the chest was performed using the standard protocol during bolus administration of intravenous contrast. Multiplanar CT image reconstructions and MIPs were obtained to evaluate the vascular anatomy. Multidetector CT imaging of the abdomen and pelvis was performed using the standard protocol during bolus administration of intravenous contrast. CONTRAST:  139mL OMNIPAQUE IOHEXOL 350 MG/ML SOLN COMPARISON:  None. FINDINGS: CTA CHEST FINDINGS Cardiovascular: Contrast injection is sufficient to demonstrate satisfactory opacification of the pulmonary arteries to the  segmental level. There is no pulmonary embolus. The main pulmonary artery is dilated measuring approximately 3.6 cm. There is no CT evidence of acute right heart strain. The visualized aorta is normal. Heart size is normal, without pericardial effusion. Mediastinum/Nodes: --No mediastinal or hilar lymphadenopathy. --No axillary lymphadenopathy. --No supraclavicular lymphadenopathy. --Normal thyroid gland. --The esophagus is unremarkable Lungs/Pleura: There are multifocal airspace opacities bilaterally. The lung volumes are low. There is no pneumothorax. There are trace bilateral pleural effusions. Musculoskeletal: No chest wall abnormality. No acute or significant osseous findings. Review of the MIP images confirms the above findings. CT ABDOMEN and PELVIS FINDINGS Hepatobiliary: There is decreased hepatic attenuation suggestive of hepatic steatosis. Cholelithiasis without acute inflammation.There is no biliary ductal dilation. Pancreas: Normal contours without ductal dilatation. No peripancreatic fluid collection. Spleen: No splenic laceration or hematoma. Adrenals/Urinary Tract: --Adrenal glands: No adrenal hemorrhage. --Right kidney/ureter: No hydronephrosis or perinephric hematoma. --Left kidney/ureter: There is a nonobstructing stone in the interpolar region of the left kidney. There is a small nonobstructing stone in the lower pole. --Urinary bladder: Unremarkable. Stomach/Bowel: --Stomach/Duodenum: No hiatal hernia or other gastric abnormality. Normal duodenal course and caliber. --Small bowel: No dilatation or inflammation. --Colon: No focal abnormality. --Appendix: Normal. Vascular/Lymphatic: Normal course and caliber of the major abdominal vessels. --No retroperitoneal lymphadenopathy. --No mesenteric lymphadenopathy. --No pelvic or inguinal lymphadenopathy. Reproductive: Status post hysterectomy. No adnexal mass. Other: No ascites or free air. The abdominal wall is normal. Musculoskeletal. No acute  displaced  fractures. Review of the MIP images confirms the above findings. IMPRESSION: 1. No acute aortic syndrome or pulmonary embolus. 2. Bilateral multifocal airspace opacities, concerning for multifocal pneumonia (viral or bacterial). 3. Trace bilateral pleural effusions. 4. Dilated main pulmonary artery, suggestive of pulmonary arterial hypertension. 5. Cholelithiasis without acute inflammation. 6. Nonobstructive left nephrolithiasis. 7. Hepatic steatosis. Electronically Signed   By: Constance Holster M.D.   On: 05/03/2019 04:21   Dg Chest Portable 1 View  Result Date: 05/03/2019 CLINICAL DATA:  Chest pain EXAM: PORTABLE CHEST 1 VIEW COMPARISON:  None. FINDINGS: The heart size and mediastinal contours are within normal limits. Both lungs are clear. The visualized skeletal structures are unremarkable. IMPRESSION: No active disease. Electronically Signed   By: Constance Holster M.D.   On: 05/03/2019 03:06    EKG: Independently reviewed.  Normal sinus rhythm.  Assessment/Plan Principal Problem:   Acute respiratory failure with hypoxia ([facility]) Active Problems:   Multifocal pneumonia   Diabetes mellitus type 2 in obese ([facility])   Gallstones    1. Acute respiratory failure with hypoxia with multifocal pneumonia primary concern is for COVID-19 pneumonia.  For now we have kept patient on empiric antibiotics for community-acquired pneumonia.  Patient did receive 1 dose of Decadron.  If Covid test did come positive will consider further dose of Decadron and may need remdesivir.  Check CRP ferritin levels urine for Legionella strep antigen follow cultures.  Since patient's CAT scan does show some pleural effusion will check BNP.  And follow procalcitonin. 2. Chest pain likely from pneumonia.  Follow LFTs and troponin. 3. Nausea with gallstones.  Abdomen appears benign on my exam.  Follow LFTs.  If nausea persist may consider HIDA scan. 4. Diabetes mellitus type 2 we will keep patient on sliding scale  coverage.  No other patient did receive Decadron and blood sugars are likely to go high. 5. Hyperlipidemia on statins.  Given that patient has acute respiratory failure with hypoxia and pneumonia will need more than 2 midnight stay in inpatient.   DVT prophylaxis: Lovenox. Code Status: Full code. Family Communication: Discussed with patient. Disposition Plan: Home. Consults called: None. Admission status: Inpatient.   Rise Patience MD [facility] Pager [phone].  If 7PM-7AM, please contact night-coverage [email or web address] Password [facility]  05/03/2019, 6:58 AM

## 2019-05-03 NOTE — ED Notes (Signed)
md at bedside

## 2019-05-03 NOTE — ED Notes (Signed)
ER MD and pt notified of positive result. Admitting hospitalist messaged.

## 2019-05-03 NOTE — Progress Notes (Signed)
PHARMACIST - PHYSICIAN COMMUNICATION  CONCERNING:  Enoxaparin (Lovenox) for DVT Prophylaxis    RECOMMENDATION: Patient was prescribed enoxaprin 40mg  q24 hours for VTE prophylaxis.   Filed Weights   05/03/19 0208  Weight: (!) 325 lb (147.4 kg)    Body mass index is 52.46 kg/m.  Estimated Creatinine Clearance: 124.1 mL/min (by C-G formula based on SCr of 0.72 mg/dL).  Based on [place] patient is candidate for enoxaparin 40mg  every 12 hour dosing due to BMI being >40.  DESCRIPTION: Pharmacy has adjusted enoxaparin dose per [facility] policy.  Patient is now receiving enoxaparin 40mg  every 12 hours.   Rowland Lathe, PharmD Clinical Pharmacist  05/03/2019 7:28 AM

## 2019-05-03 NOTE — ED Notes (Signed)
Says she did have some nausea that was related tocoughing.  Says produc tive cough.in nad now,.

## 2019-05-03 NOTE — ED Notes (Signed)
Pt given food tray.

## 2019-05-03 NOTE — ED Notes (Signed)
C/o 6/10 headache.  C/o feeling nausea and needing to eat.  Dr Kerman Passey made aware.  tyelnol given.  Crackers given while waiting lunch tray.

## 2019-05-03 NOTE — ED Provider Notes (Signed)
[facility] Emergency Department Provider Note   First MD Initiated Contact with Patient 05/03/19 [phone]     (approximate)  I have reviewed the triage vital signs and the nursing notes.   HISTORY  Chief Complaint Chest Pain and Abdominal Pain    HPI Grace Flores is a 53 y.o. female with below list of previous medical conditions presents to the emergency department with a 2-day history of diffuse chest pain and upper back pain with associated cough.  Patient denies any fever.  Patient states that current pain score is 10 out of 10.  Patient also admits to right upper abdominal pain as well.  However patient states her main pain at this time is in her chest and upper back.  Patient denies any known sick contact.       Past Medical History:  Diagnosis Date   Cancer ([place])    melanoma left shoulder, basal cell nose   Diabetes mellitus without complication ([place])    Hyperlipidemia    Kidney stones     Patient Active Problem List   Diagnosis Date Noted   Acute respiratory failure with hypoxia ([place]) 05/03/2019   Kidney stone 09/08/2017    Past Surgical History:  Procedure Laterality Date   ABDOMINAL HYSTERECTOMY     EXTRACORPOREAL SHOCK WAVE LITHOTRIPSY Left 09/22/2017   Procedure: EXTRACORPOREAL SHOCK WAVE LITHOTRIPSY (ESWL);  Surgeon: [name], MD;  Location: [facility] ORS;  Service: Urology;  Laterality: Left;   LAPAROSCOPIC ENDOMETRIOSIS FULGURATION      Prior to Admission medications   Medication Sig Start Date End Date Taking? Authorizing Provider  acetaminophen (TYLENOL) 325 MG tablet Take 2 tablets (650 mg total) by mouth every 6 (six) hours as needed for mild pain or headache. 09/08/17   [name], MD  atorvastatin (LIPITOR) 10 MG tablet Take 1 tablet by mouth daily. 07/18/17   [provider]  glimepiride (AMARYL) 4 MG tablet Take 4 mg by mouth daily with breakfast.    [provider]  ibuprofen  (ADVIL,MOTRIN) 600 MG tablet Take 1 tablet (600 mg total) by mouth every 8 (eight) hours as needed. 09/25/17   Darel Hong, MD  ondansetron (ZOFRAN) 4 MG tablet Take 1 tablet by mouth every 12 (twelve) hours. 08/31/17   [provider]  oxyCODONE (OXY IR/ROXICODONE) 5 MG immediate release tablet Take 1-2 tablets (5-10 mg total) by mouth every 4 (four) hours as needed for severe pain. 09/08/17   [name], MD  oxyCODONE-acetaminophen (PERCOCET) 10-325 MG tablet Take 1-2 tablets by mouth every 4 (four) hours as needed for pain. Maximum dose per 24 hours - 8 pills 09/22/17   [name], MD  tamsulosin [facility]) 0.4 MG CAPS capsule Take 1 capsule (0.4 mg total) by mouth daily. 09/08/17   [name], MD  tamsulosin (FLOMAX) 0.4 MG CAPS capsule Take 1 capsule (0.4 mg total) by mouth daily. 09/22/17   [name], MD    Allergies Tape  No family history on file.  Social History Social History   Tobacco Use   Smoking status: Never Smoker   Smokeless tobacco: Never Used  Substance Use Topics   Alcohol use: Yes   Drug use: No    Review of Systems Constitutional: No fever/chills Eyes: No visual changes. ENT: No sore throat. Cardiovascular: Denies chest pain. Respiratory: Positive for dyspnea and cough Gastrointestinal: No abdominal pain.  No nausea, no vomiting.  No diarrhea.  No constipation. Genitourinary: Negative  for dysuria. Musculoskeletal: Negative for neck pain.  Negative for back pain. Integumentary: Negative for rash. Neurological: Negative for headaches, focal weakness or numbness.  ____________________________________________   PHYSICAL EXAM:  VITAL SIGNS: ED Triage Vitals  Enc Vitals Group     BP 05/03/19 0213 (!) 141/63     Pulse Rate 05/03/19 0213 86     Resp 05/03/19 0213 20     Temp 05/03/19 0213 98.9 F (37.2 C)     Temp Source 05/03/19 0213 Oral     SpO2 05/03/19 0213 95 %     Weight 05/03/19 0208 (!) 147.4 kg (325 lb)      Height 05/03/19 0208 1.676 m (5\' 6" )     Head Circumference --      Peak Flow --      Pain Score 05/03/19 0208 7     Pain Loc --      Pain Edu? --      Excl. in [place]? --     Constitutional: Alert and oriented.  Apparent discomfort Eyes: Conjunctivae are normal.  Mouth/Throat: Patient is wearing a mask. Neck: No stridor.  No meningeal signs.   Cardiovascular: Normal rate, regular rhythm. Good peripheral circulation. Grossly normal heart sounds. Respiratory: Diffuse rhonchi  Gastrointestinal: Right upper quadrant tenderness to palpation.. No distention.  Musculoskeletal: No lower extremity tenderness nor edema. No gross deformities of extremities. Neurologic:  Normal speech and language. No gross focal neurologic deficits are appreciated.  Skin:  Skin is warm, dry and intact. Psychiatric: Mood and affect are normal. Speech and behavior are normal.  ____________________________________________   LABS (all labs ordered are listed, but only abnormal results are displayed)  Labs Reviewed  COMPREHENSIVE METABOLIC PANEL - Abnormal; Notable for the following components:      Result Value   CO2 20 (*)    Glucose, Bld 265 (*)    All other components within normal limits  URINALYSIS, COMPLETE (UACMP) WITH MICROSCOPIC - Abnormal; Notable for the following components:   Color, Urine YELLOW (*)    APPearance CLEAR (*)    Glucose, UA >=500 (*)    Bacteria, UA RARE (*)    All other components within normal limits  SARS CORONAVIRUS 2 (TAT 6-24 HRS)  CBC WITH DIFFERENTIAL/PLATELET  LIPASE, BLOOD  TROPONIN I (HIGH SENSITIVITY)   ____________________________________________  EKG  ED ECG REPORT I, [place] N Alphus Zeck, the attending physician, personally viewed and interpreted this ECG.   Date: 05/03/2019  EKG Time: 2:09 AM  Rate: 91  Rhythm: Normal sinus rhythm.  Axis: Normal  Intervals: Normal  ST&T Change: None  ____________________________________________  RADIOLOGY I, [place] N  Macsen Nuttall, personally viewed and evaluated these images (plain radiographs) as part of my medical decision making, as well as reviewing the written report by the radiologist.  ED MD interpretation:    Official radiology report(s): Ct Angio Chest Pe W And/or Wo Contrast  Result Date: 05/03/2019 CLINICAL DATA:  Acute abdominal pain. Epigastric pain. Complex chest pain EXAM: CT ANGIOGRAPHY CHEST CT ABDOMEN AND PELVIS WITH CONTRAST TECHNIQUE: Multidetector CT imaging of the chest was performed using the standard protocol during bolus administration of intravenous contrast. Multiplanar CT image reconstructions and MIPs were obtained to evaluate the vascular anatomy. Multidetector CT imaging of the abdomen and pelvis was performed using the standard protocol during bolus administration of intravenous contrast. CONTRAST:  144mL OMNIPAQUE IOHEXOL 350 MG/ML SOLN COMPARISON:  None. FINDINGS: CTA CHEST FINDINGS Cardiovascular: Contrast injection is sufficient to demonstrate satisfactory opacification of the pulmonary  arteries to the segmental level. There is no pulmonary embolus. The main pulmonary artery is dilated measuring approximately 3.6 cm. There is no CT evidence of acute right heart strain. The visualized aorta is normal. Heart size is normal, without pericardial effusion. Mediastinum/Nodes: --No mediastinal or hilar lymphadenopathy. --No axillary lymphadenopathy. --No supraclavicular lymphadenopathy. --Normal thyroid gland. --The esophagus is unremarkable Lungs/Pleura: There are multifocal airspace opacities bilaterally. The lung volumes are low. There is no pneumothorax. There are trace bilateral pleural effusions. Musculoskeletal: No chest wall abnormality. No acute or significant osseous findings. Review of the MIP images confirms the above findings. CT ABDOMEN and PELVIS FINDINGS Hepatobiliary: There is decreased hepatic attenuation suggestive of hepatic steatosis. Cholelithiasis without acute  inflammation.There is no biliary ductal dilation. Pancreas: Normal contours without ductal dilatation. No peripancreatic fluid collection. Spleen: No splenic laceration or hematoma. Adrenals/Urinary Tract: --Adrenal glands: No adrenal hemorrhage. --Right kidney/ureter: No hydronephrosis or perinephric hematoma. --Left kidney/ureter: There is a nonobstructing stone in the interpolar region of the left kidney. There is a small nonobstructing stone in the lower pole. --Urinary bladder: Unremarkable. Stomach/Bowel: --Stomach/Duodenum: No hiatal hernia or other gastric abnormality. Normal duodenal course and caliber. --Small bowel: No dilatation or inflammation. --Colon: No focal abnormality. --Appendix: Normal. Vascular/Lymphatic: Normal course and caliber of the major abdominal vessels. --No retroperitoneal lymphadenopathy. --No mesenteric lymphadenopathy. --No pelvic or inguinal lymphadenopathy. Reproductive: Status post hysterectomy. No adnexal mass. Other: No ascites or free air. The abdominal wall is normal. Musculoskeletal. No acute displaced fractures. Review of the MIP images confirms the above findings. IMPRESSION: 1. No acute aortic syndrome or pulmonary embolus. 2. Bilateral multifocal airspace opacities, concerning for multifocal pneumonia (viral or bacterial). 3. Trace bilateral pleural effusions. 4. Dilated main pulmonary artery, suggestive of pulmonary arterial hypertension. 5. Cholelithiasis without acute inflammation. 6. Nonobstructive left nephrolithiasis. 7. Hepatic steatosis. Electronically Signed   By: Constance Holster M.D.   On: 05/03/2019 04:21   Ct Abdomen Pelvis W Contrast  Result Date: 05/03/2019 CLINICAL DATA:  Acute abdominal pain. Epigastric pain. Complex chest pain EXAM: CT ANGIOGRAPHY CHEST CT ABDOMEN AND PELVIS WITH CONTRAST TECHNIQUE: Multidetector CT imaging of the chest was performed using the standard protocol during bolus administration of intravenous contrast. Multiplanar CT  image reconstructions and MIPs were obtained to evaluate the vascular anatomy. Multidetector CT imaging of the abdomen and pelvis was performed using the standard protocol during bolus administration of intravenous contrast. CONTRAST:  176mL OMNIPAQUE IOHEXOL 350 MG/ML SOLN COMPARISON:  None. FINDINGS: CTA CHEST FINDINGS Cardiovascular: Contrast injection is sufficient to demonstrate satisfactory opacification of the pulmonary arteries to the segmental level. There is no pulmonary embolus. The main pulmonary artery is dilated measuring approximately 3.6 cm. There is no CT evidence of acute right heart strain. The visualized aorta is normal. Heart size is normal, without pericardial effusion. Mediastinum/Nodes: --No mediastinal or hilar lymphadenopathy. --No axillary lymphadenopathy. --No supraclavicular lymphadenopathy. --Normal thyroid gland. --The esophagus is unremarkable Lungs/Pleura: There are multifocal airspace opacities bilaterally. The lung volumes are low. There is no pneumothorax. There are trace bilateral pleural effusions. Musculoskeletal: No chest wall abnormality. No acute or significant osseous findings. Review of the MIP images confirms the above findings. CT ABDOMEN and PELVIS FINDINGS Hepatobiliary: There is decreased hepatic attenuation suggestive of hepatic steatosis. Cholelithiasis without acute inflammation.There is no biliary ductal dilation. Pancreas: Normal contours without ductal dilatation. No peripancreatic fluid collection. Spleen: No splenic laceration or hematoma. Adrenals/Urinary Tract: --Adrenal glands: No adrenal hemorrhage. --Right kidney/ureter: No hydronephrosis or perinephric hematoma. --Left kidney/ureter: There is  a nonobstructing stone in the interpolar region of the left kidney. There is a small nonobstructing stone in the lower pole. --Urinary bladder: Unremarkable. Stomach/Bowel: --Stomach/Duodenum: No hiatal hernia or other gastric abnormality. Normal duodenal course and  caliber. --Small bowel: No dilatation or inflammation. --Colon: No focal abnormality. --Appendix: Normal. Vascular/Lymphatic: Normal course and caliber of the major abdominal vessels. --No retroperitoneal lymphadenopathy. --No mesenteric lymphadenopathy. --No pelvic or inguinal lymphadenopathy. Reproductive: Status post hysterectomy. No adnexal mass. Other: No ascites or free air. The abdominal wall is normal. Musculoskeletal. No acute displaced fractures. Review of the MIP images confirms the above findings. IMPRESSION: 1. No acute aortic syndrome or pulmonary embolus. 2. Bilateral multifocal airspace opacities, concerning for multifocal pneumonia (viral or bacterial). 3. Trace bilateral pleural effusions. 4. Dilated main pulmonary artery, suggestive of pulmonary arterial hypertension. 5. Cholelithiasis without acute inflammation. 6. Nonobstructive left nephrolithiasis. 7. Hepatic steatosis. Electronically Signed   By: Constance Holster M.D.   On: 05/03/2019 04:21   Dg Chest Portable 1 View  Result Date: 05/03/2019 CLINICAL DATA:  Chest pain EXAM: PORTABLE CHEST 1 VIEW COMPARISON:  None. FINDINGS: The heart size and mediastinal contours are within normal limits. Both lungs are clear. The visualized skeletal structures are unremarkable. IMPRESSION: No active disease. Electronically Signed   By: Constance Holster M.D.   On: 05/03/2019 03:06    ____________________________________________   PROCEDURES     Procedures   ____________________________________________   INITIAL IMPRESSION / MDM / [place] / ED COURSE  As part of my medical decision making, I reviewed the following data within the electronic MEDICAL RECORD NUMBER   53 year old female presented with above-stated history and physical exam clinical concern for possible pneumonia, COVID-19 infection, pulmonary emboli.  Regarding the patient's abdominal discomfort given right upper quadrant abdominal pain concern for possible  cholelithiasis versus diverticulitis which is less likely.  CT scan of the chest abdomen pelvis performed which revealed multifocal passages concerning for pneumonia.  COVID-19 test pending.  CT also revealed evidence of cholelithiasis as well.  Patient given ceftriaxone and azithromycin IV.  Patient discussed with Dr. Hal Hope for hospital admission for further evaluation and management.  ____________________________________________  FINAL CLINICAL IMPRESSION(S) / ED DIAGNOSES  Final diagnoses:  Acute respiratory failure with hypoxia (HCC)  Multifocal pneumonia     MEDICATIONS GIVEN DURING THIS VISIT:  Medications  azithromycin (ZITHROMAX) 500 mg in sodium chloride 0.9 % 250 mL IVPB (has no administration in time range)  morphine 4 MG/ML injection 4 mg (4 mg Intravenous Given 05/03/19 0229)  ondansetron (ZOFRAN) injection 4 mg (4 mg Intravenous Given 05/03/19 0229)  HYDROmorphone (DILAUDID) injection 1 mg (1 mg Intravenous Given 05/03/19 0305)  iohexol (OMNIPAQUE) 350 MG/ML injection 125 mL (125 mLs Intravenous Contrast Given 05/03/19 0323)  ondansetron (ZOFRAN) injection 4 mg (4 mg Intravenous Given 05/03/19 0410)  cefTRIAXone (ROCEPHIN) 1 g in sodium chloride 0.9 % 100 mL IVPB (0 g Intravenous Stopped 05/03/19 0525)     ED Discharge Orders    None      *Please note:  Cyndee Sundby Magee was evaluated in Emergency Department on 05/03/2019 for the symptoms described in the history of present illness. She was evaluated in the context of the global COVID-19 pandemic, which necessitated consideration that the patient might be at risk for infection with the SARS-CoV-2 virus that causes COVID-19. Institutional protocols and algorithms that pertain to the evaluation of patients at risk for COVID-19 are in a state of rapid change based on information released by  regulatory bodies including the CDC and federal and state organizations. These policies and algorithms were followed during the patient's  care in the ED.  Some ED evaluations and interventions may be delayed as a result of limited staffing during the pandemic.*  Note:  This document was prepared using Dragon voice recognition software and may include unintentional dictation errors.   Gregor Hams, MD 05/03/19 0600

## 2019-05-03 NOTE — ED Triage Notes (Signed)
Patient ambulatory to triage with steady gait, without difficulty, appears uncomfortable, grimacing; mask in place; pt reports x 2 days having mid CP radiating thru to back and rt upper abd/side

## 2019-05-03 NOTE — ED Notes (Addendum)
IV in the right forearm has infiltrated. Charge RN notified and ice applied to site. Azithromycin infusing moved to the left ac.

## 2019-05-04 DIAGNOSIS — Z91048 Other nonmedicinal substance allergy status: Secondary | ICD-10-CM

## 2019-05-04 DIAGNOSIS — D72819 Decreased white blood cell count, unspecified: Secondary | ICD-10-CM

## 2019-05-04 DIAGNOSIS — J069 Acute upper respiratory infection, unspecified: Secondary | ICD-10-CM

## 2019-05-04 DIAGNOSIS — Z87442 Personal history of urinary calculi: Secondary | ICD-10-CM

## 2019-05-04 DIAGNOSIS — Z7984 Long term (current) use of oral hypoglycemic drugs: Secondary | ICD-10-CM

## 2019-05-04 DIAGNOSIS — U071 COVID-19: Principal | ICD-10-CM

## 2019-05-04 DIAGNOSIS — K810 Acute cholecystitis: Secondary | ICD-10-CM

## 2019-05-04 DIAGNOSIS — R079 Chest pain, unspecified: Secondary | ICD-10-CM

## 2019-05-04 DIAGNOSIS — K8080 Other cholelithiasis without obstruction: Secondary | ICD-10-CM

## 2019-05-04 DIAGNOSIS — E118 Type 2 diabetes mellitus with unspecified complications: Secondary | ICD-10-CM

## 2019-05-04 DIAGNOSIS — E785 Hyperlipidemia, unspecified: Secondary | ICD-10-CM

## 2019-05-04 LAB — PROCALCITONIN: Procalcitonin: <0.1 ng/mL

## 2019-05-04 LAB — LACTATE DEHYDROGENASE: LDH: 180 U/L (ref 98–192)

## 2019-05-04 LAB — GLUCOSE, CAPILLARY
Glucose-Capillary: 219 mg/dL — ABNORMAL HIGH (ref 70–99)
Glucose-Capillary: 226 mg/dL — ABNORMAL HIGH (ref 70–99)
Glucose-Capillary: 249 mg/dL — ABNORMAL HIGH (ref 70–99)
Glucose-Capillary: 280 mg/dL — ABNORMAL HIGH (ref 70–99)
Glucose-Capillary: 324 mg/dL — ABNORMAL HIGH (ref 70–99)

## 2019-05-04 LAB — FIBRIN DERIVATIVES D-DIMER (ARMC ONLY): Fibrin derivatives D-dimer (ARMC): 256.25 ng/mL (FEU) (ref 0.00–499.00)

## 2019-05-04 LAB — LACTIC ACID, PLASMA
Lactic Acid, Venous: 1.1 mmol/L (ref 0.5–1.9)
Lactic Acid, Venous: 1.1 mmol/L (ref 0.5–1.9)

## 2019-05-04 LAB — CBC
HCT: 45.4 % (ref 36.0–46.0)
Hemoglobin: 14.6 g/dL (ref 12.0–15.0)
MCH: 28.7 pg (ref 26.0–34.0)
MCHC: 32.2 g/dL (ref 30.0–36.0)
MCV: 89.2 fL (ref 80.0–100.0)
Platelets: 149 10*3/uL — ABNORMAL LOW (ref 150–400)
RBC: 5.09 MIL/uL (ref 3.87–5.11)
RDW: 13.4 % (ref 11.5–15.5)
WBC: 2.9 10*3/uL — ABNORMAL LOW (ref 4.0–10.5)
nRBC: 0 % (ref 0.0–0.2)

## 2019-05-04 LAB — COMPREHENSIVE METABOLIC PANEL
ALT: 37 U/L (ref 0–44)
AST: 27 U/L (ref 15–41)
Albumin: 3.8 g/dL (ref 3.5–5.0)
Alkaline Phosphatase: 66 U/L (ref 38–126)
Anion gap: 8 (ref 5–15)
BUN: 10 mg/dL (ref 6–20)
CO2: 26 mmol/L (ref 22–32)
Calcium: 8.7 mg/dL — ABNORMAL LOW (ref 8.9–10.3)
Chloride: 105 mmol/L (ref 98–111)
Creatinine, Ser: 0.71 mg/dL (ref 0.44–1.00)
GFR calc Af Amer: >60 mL/min (ref >60)
GFR calc non Af Amer: >60 mL/min (ref >60)
Glucose, Bld: 306 mg/dL — ABNORMAL HIGH (ref 70–99)
Potassium: 4.1 mmol/L (ref 3.5–5.1)
Sodium: 139 mmol/L (ref 135–145)
Total Bilirubin: 0.7 mg/dL (ref 0.3–1.2)
Total Protein: 7.1 g/dL (ref 6.5–8.1)

## 2019-05-04 LAB — HEMOGLOBIN A1C
Hgb A1c MFr Bld: 9.1 % — ABNORMAL HIGH (ref 4.8–5.6)
Mean Plasma Glucose: 214.47 mg/dL

## 2019-05-04 LAB — LEGIONELLA PNEUMOPHILA SEROGP 1 UR AG: L. pneumophila Serogp 1 Ur Ag: NEGATIVE

## 2019-05-04 LAB — FERRITIN: Ferritin: 217 ng/mL (ref 11–307)

## 2019-05-04 LAB — TROPONIN I (HIGH SENSITIVITY): Troponin I (High Sensitivity): 3 ng/L (ref <18)

## 2019-05-04 LAB — C-REACTIVE PROTEIN: CRP: 1.4 mg/dL — ABNORMAL HIGH (ref <1.0)

## 2019-05-04 LAB — ABO/RH: ABO/RH(D): B POS

## 2019-05-04 MED ORDER — SODIUM CHLORIDE 0.9 % IV SOLN
2.0000 g | INTRAVENOUS | Status: DC
  Administered 2019-05-04: 2 g via INTRAVENOUS
  Filled 2019-05-04: qty 2
  Filled 2019-05-04 (×2): qty 20

## 2019-05-04 MED ORDER — SODIUM CHLORIDE 0.9 % IV SOLN
200.0000 mg | Freq: Once | INTRAVENOUS | Status: DC
  Filled 2019-05-04: qty 40

## 2019-05-04 MED ORDER — LINAGLIPTIN 5 MG PO TABS
5.0000 mg | ORAL_TABLET | Freq: Every day | ORAL | Status: DC
  Administered 2019-05-04 – 2019-05-06 (×3): 5 mg via ORAL
  Filled 2019-05-04 (×3): qty 1

## 2019-05-04 MED ORDER — INSULIN ASPART 100 UNIT/ML ~~LOC~~ SOLN
0.0000 [IU] | Freq: Three times a day (TID) | SUBCUTANEOUS | Status: DC
  Administered 2019-05-04: 7 [IU] via SUBCUTANEOUS
  Filled 2019-05-04: qty 1

## 2019-05-04 MED ORDER — SODIUM CHLORIDE 0.9 % IV SOLN
100.0000 mg | INTRAVENOUS | Status: DC
  Administered 2019-05-05 – 2019-05-06 (×2): 100 mg via INTRAVENOUS
  Filled 2019-05-04 (×2): qty 20

## 2019-05-04 MED ORDER — DEXAMETHASONE SODIUM PHOSPHATE 10 MG/ML IJ SOLN
6.0000 mg | Freq: Every day | INTRAMUSCULAR | Status: DC
  Administered 2019-05-05 – 2019-05-06 (×2): 6 mg via INTRAVENOUS
  Filled 2019-05-04 (×2): qty 0.6

## 2019-05-04 MED ORDER — INSULIN ASPART 100 UNIT/ML ~~LOC~~ SOLN
0.0000 [IU] | Freq: Three times a day (TID) | SUBCUTANEOUS | Status: DC
  Administered 2019-05-04: 11 [IU] via SUBCUTANEOUS
  Administered 2019-05-04: 15 [IU] via SUBCUTANEOUS
  Administered 2019-05-05: 4 [IU] via SUBCUTANEOUS
  Administered 2019-05-05: 7 [IU] via SUBCUTANEOUS
  Administered 2019-05-05: 15 [IU] via SUBCUTANEOUS
  Administered 2019-05-06: 4 [IU] via SUBCUTANEOUS
  Administered 2019-05-06: 11 [IU] via SUBCUTANEOUS
  Filled 2019-05-04 (×7): qty 1

## 2019-05-04 MED ORDER — VITAMIN C 500 MG PO TABS
500.0000 mg | ORAL_TABLET | Freq: Every day | ORAL | Status: DC
  Administered 2019-05-04 – 2019-05-06 (×3): 500 mg via ORAL
  Filled 2019-05-04 (×3): qty 1

## 2019-05-04 MED ORDER — INSULIN GLARGINE 100 UNIT/ML ~~LOC~~ SOLN
10.0000 [IU] | Freq: Every day | SUBCUTANEOUS | Status: DC
  Administered 2019-05-04 – 2019-05-06 (×3): 10 [IU] via SUBCUTANEOUS
  Filled 2019-05-04 (×4): qty 0.1

## 2019-05-04 MED ORDER — ZINC SULFATE 220 (50 ZN) MG PO CAPS
220.0000 mg | ORAL_CAPSULE | Freq: Every day | ORAL | Status: DC
  Administered 2019-05-04 – 2019-05-06 (×3): 220 mg via ORAL
  Filled 2019-05-04 (×3): qty 1

## 2019-05-04 MED ORDER — SODIUM CHLORIDE 0.9 % IV SOLN
200.0000 mg | Freq: Once | INTRAVENOUS | Status: AC
  Administered 2019-05-04: 200 mg via INTRAVENOUS
  Filled 2019-05-04: qty 40

## 2019-05-04 MED ORDER — SODIUM CHLORIDE 0.9 % IV SOLN: 100.0000 mg | INTRAVENOUS | Status: DC

## 2019-05-04 MED ORDER — INSULIN ASPART 100 UNIT/ML ~~LOC~~ SOLN
0.0000 [IU] | Freq: Every day | SUBCUTANEOUS | Status: DC
  Administered 2019-05-04: 2 [IU] via SUBCUTANEOUS
  Administered 2019-05-05: 4 [IU] via SUBCUTANEOUS
  Filled 2019-05-04 (×2): qty 1

## 2019-05-04 NOTE — Progress Notes (Signed)
Inpatient Diabetes Program Recommendations  AACE/ADA: New Consensus Statement on Inpatient Glycemic Control (2015)  Target Ranges:  Prepandial:   less than 140 mg/dL      Peak postprandial:   less than 180 mg/dL (1-2 hours)      Critically ill patients:  140 - 180 mg/dL   Results for ELIZEBETH, RUSHMORE (MRN [id number]) as of 05/04/2019 08:49  Ref. Range 05/03/2019 07:22 05/03/2019 12:26 05/04/2019 00:47 05/04/2019 07:59  Glucose-Capillary Latest Ref Range: 70 - 99 mg/dL 181 (H) 127 (H) 226 (H) 249 (H)  7 units NOVOLOG     Admit with: Acute respiratory failure with hypoxia with multifocal pneumonia/ COVID 19 +/ Acute Cholecystitis  History: DM  Home DM Meds: Amaryl 4 mg BID       Actos 30 mg Daily  Current Orders: Novolog Resistant Correction Scale/ SSI (0-20 units) TID AC     Getting Solumedrol 125 mg Q6 hours.  CBGs on the rise with addition of steroids.     MD- Please consider the following in-hospital insulin adjustments while patient getting IV Steroids:  1. Start Lantus 15 units Daily (0.1 units/kg dosing based on weight of 147 kg)--Please start today   2. Increase frequency of Novolog SSI to Q4 hours      --Will follow patient during hospitalization--  Wyn Quaker RN, MSN, CDE Diabetes Coordinator Inpatient Glycemic Control Team Team Pager: [phone] (8a-5p)

## 2019-05-04 NOTE — Consult Note (Addendum)
NAME: Grace Flores  DOB: 06/26/1966  MRN: [id number]  Date/Time: 05/04/2019 9:37 AM  REQUESTING PROVIDER:ouma Subjective:  REASON FOR CONSULT: COVID 19 ? Grace Flores is a 53 y.o. female with a history of DM,renal stone, hyperlipidemia Is admitted with  Pain rt side of chest and cough since Monday the patient works for [employer] from home and on Monday she started coughing, she also experienced pain rt side of the chest and back and thought it was due to muscle strain. She had low grade fever of 99.8. She took tylenol for the pain. The pain got worse , so was the cough and she came to the ED on 05/03/19. Vitals in the ED were temp of 98.9, BP 141/63, pulse ox 95 % ( had dipped to 92%) and BP of 141/63.  She had a CT abdomen and chest that showed gall stones without inflammation and also multifocal infiltrate. SARS cov 2 came back positive- She has been started on remdisivir and decadron. I am seeing her COVID 19 illness Pt  lives with her husband and 3 daughters- her husband has cough  And is staying home ( He works at [employer]) She says her last Hba1c is 9  Past Medical History:  Diagnosis Date  . Cancer ([place])    melanoma left shoulder, basal cell nose  . Diabetes mellitus without complication ([place])   . Hyperlipidemia   . Kidney stones    b/l heel spurs Past Surgical History:  Procedure Laterality Date  . ABDOMINAL HYSTERECTOMY    . EXTRACORPOREAL SHOCK WAVE LITHOTRIPSY Left 09/22/2017   Procedure: EXTRACORPOREAL SHOCK WAVE LITHOTRIPSY (ESWL);  Surgeon: [name], MD;  Location: [facility] ORS;  Service: Urology;  Laterality: Left;  . LAPAROSCOPIC ENDOMETRIOSIS FULGURATION     melanoma in situ -excision   SH Non smoker No alcohol No drugs   Family History  Family history unknown: Yes   Allergies  Allergen Reactions  . Tape Other (See Comments)    Blistering skin and skin comes off    ? Current Facility-Administered Medications  Medication Dose Route  Frequency Provider Last Rate Last Dose  . acetaminophen (TYLENOL) tablet 650 mg  650 mg Oral Q6H PRN Rise Patience, MD   650 mg at 05/03/19 1730   Or  . acetaminophen (TYLENOL) suppository 650 mg  650 mg Rectal Q6H PRN Rise Patience, MD      . atorvastatin (LIPITOR) tablet 10 mg  10 mg Oral Daily Rise Patience, MD   10 mg at 05/04/19 0814  . cefTRIAXone (ROCEPHIN) 2 g in sodium chloride 0.9 % 100 mL IVPB  2 g Intravenous Q24H Rise Patience, MD   Stopped at 05/04/19 0104  . [START ON 05/05/2019] dexamethasone (DECADRON) injection 6 mg  6 mg Intravenous Daily Danford, Christopher P, MD      . enoxaparin (LOVENOX) injection 40 mg  40 mg Subcutaneous Q12H Rise Patience, MD   40 mg at 05/04/19 0814  . insulin aspart (novoLOG) injection 0-20 Units  0-20 Units Subcutaneous TID WC Danford, Christopher P, MD      . insulin aspart (novoLOG) injection 0-5 Units  0-5 Units Subcutaneous QHS Danford, Christopher P, MD      . insulin glargine (LANTUS) injection 10 Units  10 Units Subcutaneous Daily Danford, Christopher P, MD      . linagliptin (TRADJENTA) tablet 5 mg  5 mg Oral Daily Danford, Suann Larry, MD      . ondansetron (  ZOFRAN) tablet 4 mg  4 mg Oral Q6H PRN Rise Patience, MD       Or  . ondansetron [facility]) injection 4 mg  4 mg Intravenous Q6H PRN Rise Patience, MD      . Derrill Memo ON 05/05/2019] remdesivir 100 mg in sodium chloride 0.9 % 250 mL IVPB  100 mg Intravenous Q24H Rise Patience, MD      . sodium chloride 0.9 % bolus 500 mL  500 mL Intravenous Once Swayze, Ava, DO      . vitamin C (ASCORBIC ACID) tablet 500 mg  500 mg Oral Daily Danford, Suann Larry, MD      . zinc sulfate capsule 220 mg  220 mg Oral Daily Danford, Suann Larry, MD         Abtx:  Anti-infectives (From admission, onward)   Start     Dose/Rate Route Frequency Ordered Stop   05/05/19 0700  remdesivir 100 mg in sodium chloride 0.9 % 250 mL IVPB     100 mg 500 mL/hr  over 30 Minutes Intravenous Every 24 hours 05/04/19 0619 05/09/19 0659   05/05/19 0600  remdesivir 100 mg in sodium chloride 0.9 % 250 mL IVPB  Status:  Discontinued     100 mg 500 mL/hr over 30 Minutes Intravenous Every 24 hours 05/04/19 0536 05/04/19 0619   05/04/19 0700  remdesivir 200 mg in sodium chloride 0.9 % 250 mL IVPB     200 mg 500 mL/hr over 30 Minutes Intravenous Once 05/04/19 0619 05/04/19 0900   05/04/19 0600  remdesivir 200 mg in sodium chloride 0.9 % 250 mL IVPB  Status:  Discontinued     200 mg 500 mL/hr over 30 Minutes Intravenous Once 05/04/19 0536 05/04/19 0619   05/03/19 2200  cefTRIAXone (ROCEPHIN) 2 g in sodium chloride 0.9 % 100 mL IVPB     2 g 200 mL/hr over 30 Minutes Intravenous Every 24 hours 05/03/19 0657 05/08/19 2159   05/03/19 2200  azithromycin (ZITHROMAX) 500 mg in sodium chloride 0.9 % 250 mL IVPB  Status:  Discontinued     500 mg 250 mL/hr over 60 Minutes Intravenous Every 24 hours 05/03/19 0657 05/04/19 0903   05/03/19 0430  cefTRIAXone (ROCEPHIN) 1 g in sodium chloride 0.9 % 100 mL IVPB     1 g 200 mL/hr over 30 Minutes Intravenous  Once 05/03/19 0428 05/03/19 0525   05/03/19 0430  azithromycin (ZITHROMAX) 500 mg in sodium chloride 0.9 % 250 mL IVPB     500 mg 250 mL/hr over 60 Minutes Intravenous  Once 05/03/19 0428 05/03/19 0729      REVIEW OF SYSTEMS:  Const: negative fever, negative chills, negative weight loss Eyes: negative diplopia or visual changes, negative eye pain ENT: negative coryza, negative sore throat Resp:  cough, no hemoptysis, dyspnea Cards:positive chest pain, no palpitations, lower extremity edema GU: negative for frequency, dysuria and hematuria GI: Negative for abdominal pain, diarrhea, bleeding, constipation Skin: negative for rash and pruritus Heme: negative for easy bruising and gum/nose bleeding MS: negative for myalgias, arthralgias, back pain and muscle weakness Neurolo:negative for headaches, dizziness, vertigo,  memory problems  Psych: negative for feelings of anxiety, depression  Endocrine: DM not well controlled Allergy/Immunology- negative for any medication or food allergies ? Objective:  VITALS:  BP 128/77 (BP Location: Left Arm)   Pulse 80   Temp 98.2 F (36.8 C) (Oral)   Resp 18   Ht 5\' 6"  (1.676 m)   Wt Marland Kitchen)  147.4 kg   SpO2 96%   BMI 52.45 kg/m  PHYSICAL EXAM:  General: Alert, cooperative, no distress, appears stated age. BMI> 50 Head: Normocephalic, without obvious abnormality, atraumatic. Eyes: Conjunctivae clear, anicteric sclerae. Pupils are equal ENT did not examine due to mask Neck: Supple, symmetrical, no adenopathy, thyroid: non tender no carotid bruit and no JVD. Back: No CVA tenderness. Lungs: b/l air entry Heart: Regular rate and rhythm, no murmur, rub or gallop. Abdomen: Soft,obese- no obvious tenderness Extremities: atraumatic, no cyanosis. No edema. No clubbing Skin: No rashes or lesions. Or bruising Lymph: Cervical, supraclavicular normal. Neurologic: Grossly non-focal Pertinent Labs Lab Results CBC    Component Value Date/Time   WBC 2.9 (L) 05/04/2019 0413   RBC 5.09 05/04/2019 0413   HGB 14.6 05/04/2019 0413   HCT 45.4 05/04/2019 0413   PLT 149 (L) 05/04/2019 0413   MCV 89.2 05/04/2019 0413   MCH 28.7 05/04/2019 0413   MCHC 32.2 05/04/2019 0413   RDW 13.4 05/04/2019 0413   LYMPHSABS 1.3 05/03/2019 0751   MONOABS 0.4 05/03/2019 0751   EOSABS 0.1 05/03/2019 0751   BASOSABS 0.0 05/03/2019 0751    CMP Latest Ref Rng & Units 05/04/2019 05/03/2019 05/03/2019  Glucose 70 - 99 mg/dL 306(H) 188(H) 265(H)  BUN 6 - 20 mg/dL 10 9 11   Creatinine 0.44 - 1.00 mg/dL 0.71 0.59 0.72  Sodium 135 - 145 mmol/L 139 139 138  Potassium 3.5 - 5.1 mmol/L 4.1 3.8 3.8  Chloride 98 - 111 mmol/L 105 105 107  CO2 22 - 32 mmol/L 26 26 20(L)  Calcium 8.9 - 10.3 mg/dL 8.7(L) 8.6(L) 9.0  Total Protein 6.5 - 8.1 g/dL 7.1 7.0 6.7  Total Bilirubin 0.3 - 1.2 mg/dL 0.7 0.6 0.6   Alkaline Phos 38 - 126 U/L 66 71 68  AST 15 - 41 U/L 27 29 37  ALT 0 - 44 U/L 37 35 36      Microbiology: Recent Results (from the past 240 hour(s))  SARS CORONAVIRUS 2 (TAT 6-24 HRS) Nasopharyngeal Nasopharyngeal Swab     Status: Abnormal   Collection Time: 05/03/19  4:41 AM   Specimen: Nasopharyngeal Swab  Result Value Ref Range Status   SARS Coronavirus 2 POSITIVE (A) NEGATIVE Final    Comment: RESULT CALLED TO, READ BACK BY AND VERIFIED WITH: S.JONES,RN [id number] I.MANNING (NOTE) SARS-CoV-2 target nucleic acids are DETECTED. The SARS-CoV-2 RNA is generally detectable in upper and lower respiratory specimens during the acute phase of infection. Positive results are indicative of active infection with SARS-CoV-2. Clinical  correlation with patient history and other diagnostic information is necessary to determine patient infection status. Positive results do  not rule out bacterial infection or co-infection with other viruses. The expected result is Negative. Fact Sheet for Patients: SugarRoll.be Fact Sheet for Healthcare Providers: https://www.woods-mathews.com/ This test is not yet approved or cleared by the [place] FDA and  has been authorized for detection and/or diagnosis of SARS-CoV-2 by FDA under an Emergency Use Authorization (EUA). This EUA will remain  in effect (meaning this test can be used) for the  duration of the COVID-19 declaration under Section 564(b)(1) of the Act, 21 U.S.C. section 360bbb-3(b)(1), unless the authorization is terminated or revoked sooner. Performed at [facility], [place] [address] [place], [place] [address]     IMAGING RESULTS: CT chest - multifocal pneumonia   CT abdomen Gall stones without inflammation of gall bladder I have personally reviewed the films ? Impression/Recommendation ?53 yr  female with DM  COVID 19 illness with b/l pulmonary infiltrate- Pulse ox had dipped  below 92 and she is on oxygen and also on remdisivir ( for 5 days) and decadron. procalcitonin < 0.1 hence not bacterial pneumonia CRP/ferritn okay  Leucopenia - due to the above viral illness ( HIV NR)  Gall stones causing severe abdominal pain- no cholecystitis on CT- on ceftriaxone- can Dc it  DM- was on actos and glimeparide- Started here  on lantus and sliding insulin as she has been started on decadron   ? ? ___________________________________________________ Discussed with patient and her nurse Note:  This document was prepared using Dragon voice recognition software and may include unintentional dictation errors.

## 2019-05-04 NOTE — Progress Notes (Signed)
Pharmacy Antibiotic Note  Grace Flores is a 53 y.o. female admitted on 05/03/2019 with pneumonia.  Pharmacy has been consulted for remdesivir dosing.  Plan: Remdesivir - Pharmacy Brief Note   O:  ALT: 37 CXR: IMPRESSION: 1. No acute aortic syndrome or pulmonary embolus. 2. Bilateral multifocal airspace opacities, concerning for multifocal pneumonia (viral or bacterial). SpO2: 98% on 2L [place]   A/P:  Remdesivir 200 mg IVPB once followed by 100 mg IVPB daily x 4 days.    Height: 5\' 6"  (167.6 cm) Weight: (!) 324 lb 15.3 oz (147.4 kg) IBW/kg (Calculated) : 59.3  Temp (24hrs), Avg:98.4 F (36.9 C), Min:98.2 F (36.8 C), Max:98.7 F (37.1 C)  Recent Labs  Lab 05/03/19 0214 05/03/19 0751 05/04/19 0101 05/04/19 0413  WBC 4.1 3.6*  --  2.9*  CREATININE 0.72 0.59  --  0.71  LATICACIDVEN  --   --  1.1 1.1    Estimated Creatinine Clearance: 124.1 mL/min (by C-G formula based on SCr of 0.71 mg/dL).    Allergies  Allergen Reactions  . Tape Other (See Comments)    Blistering skin and skin comes off   Thank you for allowing pharmacy to be a part of this patient's care.  Tobie Lords, PharmD, BCPS Clinical Pharmacist 05/04/2019 5:37 AM

## 2019-05-04 NOTE — Progress Notes (Signed)
PROGRESS NOTE    Grace Flores  [id number] DOB: 1965-07-08 DOA: 05/03/2019 PCP: Idelle Crouch, MD      Brief Narrative:  Grace Flores is a 53 y.o. F with HTN, DM, kidney stones, and psoriasis not on meds who presented with flu-like symptoms, diarrhea, abdominal pain/cramps, and SOB/cough for 3 days.  In the ER, required 4L O2 by [place].  CTA chest showed multifocal pneumonia, no PE.  CT abdomen showed gallstones.  COVID+.  Started on remdesivir and steroids.           Assessment & Plan:  Coronavirus pneumonitis, moderate COVID In the setting of the ongoing 2020 COVID-19 pandemic.  CT chest shows extensive multifocal pneumonia.  SpO2 <94%.   Inflammatory markers reassuring Stable on 2L, afebrile overnight, RR normal now -Continue remdesivir -Continue steroids, decrease to Decadron 6 daily  -VTE PPx with Lovenox -Continue Zinc and Vitamin C -Flutter valve, turn, cough, incentive spirometry q2hrs while awake     Acute cholecystitis, possible LFTs remain normal.  WBC 2.9 due to COVID.    CLinically she appears well, but US abdomen yesteday suggested "thickened wall and positive sonographic Percell Miller, raising concern for cholecystitis" -Continue Ceftriaxone for now -Clears -Dilaudid for pain, ondansetron for nausea  Diabetes Glucoses elevated on steorids. -Reduce steroids -Start Lantus -Increase SS correction insulin -Start linagliptin  -Continue atorvastatin        MDM and disposition: The below labs and imaging reports were reviewed and summarized above.  Medication management as above.   The patient was admitted with COVID-19.  There is a possibility she is she has some cholecystitis, so for that she will remain here in case she would deteriorate and need IR percutaneous drainage.        DVT prophylaxis: Lovenox Code Status: FULL Family Communication:      Procedures:   11/4 CTA chest  -- no PE noted, positive for multifocal  pneumonia   11/4 CT abdomen and pelvis -- thickened GB  11/5 US abdomen -- GB stones, thickened GB, murphy's sign  Antimicrobials:   Ceftriaxone 11/4 >>  Zithromax 11/4>>11/5  Remdesivir 11/6 >>   Culture data:  None     Subjective: Still having some right scapular pain, right upper quadrant pain, she does not feel that this is associated with food however.  She is had no vomiting.  No fever.  No confusion, chest discomfort, shortness of breath.  Objective: Vitals:   05/04/19 0029 05/04/19 0428 05/04/19 0439 05/04/19 0802  BP: 131/77 139/86  128/77  Pulse: 79 79  80  Resp: 18   18  Temp: 98.7 F (37.1 C) 98.2 F (36.8 C)  98.2 F (36.8 C)  TempSrc: Oral Oral  Oral  SpO2: 95% 98%  96%  Weight: (!) 147.4 kg  (!) 147.4 kg   Height: 5\' 6"  (1.676 m)      No intake or output data in the 24 hours ending 05/04/19 1628 Filed Weights   05/03/19 0208 05/04/19 0029 05/04/19 0439  Weight: (!) 147.4 kg (!) 147.4 kg (!) 147.4 kg    Examination: General appearance:  adult female, alert and in no acute distress sitting in recliner HEENT: Anicteric, conjunctiva pink, lids and lashes normal. No nasal deformity, discharge, epistaxis.  Lips moist, dentition normal, oropharynx moist, no oral lesions.   Skin: Warm and dry.  No jaundice.  No suspicious rashes or lesions. Cardiac: RRR, nl S1-S2, no murmurs appreciated.  Capillary refill is brisk.  JVP not  visible.  No LE edema.  Radia  pulses 2+ and symmetric. Respiratory: Normal respiratory rate and rhythm.  CTAB without rales or wheezes. Abdomen: Abdomen soft.  Mild right upper quadrant TTP without voluntary guarding. No ascites, distension, hepatosplenomegaly.   MSK: No deformities or effusions. Neuro: Awake and alert.  EOMI, moves all extremities. Speech fluent.    Psych: Sensorium intact and responding to questions, attention normal. Affect normal.  Judgment and insight appear normal.     Data Reviewed: I have personally  reviewed following labs and imaging studies:  CBC: Recent Labs  Lab 05/03/19 0214 05/03/19 0751 05/04/19 0413  WBC 4.1 3.6* 2.9*  NEUTROABS 1.8 1.7  --   HGB 14.3 14.3 14.6  HCT 43.8 44.5 45.4  MCV 88.1 89.0 89.2  PLT 152 156 [id number]*   Basic Metabolic Panel: Recent Labs  Lab 05/03/19 0214 05/03/19 0751 05/04/19 0413  NA 138 139 139  K 3.8 3.8 4.1  CL 107 105 105  CO2 20* 26 26  GLUCOSE 265* 188* 306*  BUN 11 9 10   CREATININE 0.72 0.59 0.71  CALCIUM 9.0 8.6* 8.7*   GFR: Estimated Creatinine Clearance: 124.1 mL/min (by C-G formula based on SCr of 0.71 mg/dL). Liver Function Tests: Recent Labs  Lab 05/03/19 0214 05/03/19 0751 05/04/19 0413  AST 37 29 27  ALT 36 35 37  ALKPHOS 68 71 66  BILITOT 0.6 0.6 0.7  PROT 6.7 7.0 7.1  ALBUMIN 3.7 3.9 3.8   Recent Labs  Lab 05/03/19 0214 05/03/19 0751  LIPASE 27 23   No results for input(s): AMMONIA in the last 168 hours. Coagulation Profile: No results for input(s): INR, PROTIME in the last 168 hours. Cardiac Enzymes: No results for input(s): CKTOTAL, CKMB, CKMBINDEX, TROPONINI in the last 168 hours. BNP (last 3 results) No results for input(s): PROBNP in the last 8760 hours. HbA1C: Recent Labs    05/04/19 0413  HGBA1C 9.1*   CBG: Recent Labs  Lab 05/03/19 0722 05/03/19 1226 05/04/19 0047 05/04/19 0759 05/04/19 1233  GLUCAP 181* 127* 226* 249* 280*   Lipid Profile: No results for input(s): CHOL, HDL, LDLCALC, TRIG, CHOLHDL, LDLDIRECT in the last 72 hours. Thyroid Function Tests: No results for input(s): TSH, T4TOTAL, FREET4, T3FREE, THYROIDAB in the last 72 hours. Anemia Panel: Recent Labs    05/04/19 0101  FERRITIN 217   Urine analysis:    Component Value Date/Time   COLORURINE YELLOW (A) 05/03/2019 0214   APPEARANCEUR CLEAR (A) 05/03/2019 0214   LABSPEC 1.017 05/03/2019 0214   PHURINE 5.0 05/03/2019 0214   GLUCOSEU >=500 (A) 05/03/2019 0214   HGBUR NEGATIVE 05/03/2019 0214   BILIRUBINUR  NEGATIVE 05/03/2019 0214   KETONESUR NEGATIVE 05/03/2019 0214   PROTEINUR NEGATIVE 05/03/2019 0214   NITRITE NEGATIVE 05/03/2019 0214   LEUKOCYTESUR NEGATIVE 05/03/2019 0214   Sepsis Labs: @LABRCNTIP (procalcitonin:4,lacticacidven:4)  ) Recent Results (from the past 240 hour(s))  SARS CORONAVIRUS 2 (TAT 6-24 HRS) Nasopharyngeal Nasopharyngeal Swab     Status: Abnormal   Collection Time: 05/03/19  4:41 AM   Specimen: Nasopharyngeal Swab  Result Value Ref Range Status   SARS Coronavirus 2 POSITIVE (A) NEGATIVE Final    Comment: RESULT CALLED TO, READ BACK BY AND VERIFIED WITH: S.JONES,RN [id number] I.MANNING (NOTE) SARS-CoV-2 target nucleic acids are DETECTED. The SARS-CoV-2 RNA is generally detectable in upper and lower respiratory specimens during the acute phase of infection. Positive results are indicative of active infection with SARS-CoV-2. Clinical  correlation with patient history and  other diagnostic information is necessary to determine patient infection status. Positive results do  not rule out bacterial infection or co-infection with other viruses. The expected result is Negative. Fact Sheet for Patients: SugarRoll.be Fact Sheet for Healthcare Providers: https://www.woods-mathews.com/ This test is not yet approved or cleared by the [place] FDA and  has been authorized for detection and/or diagnosis of SARS-CoV-2 by FDA under an Emergency Use Authorization (EUA). This EUA will remain  in effect (meaning this test can be used) for the  duration of the COVID-19 declaration under Section 564(b)(1) of the Act, 21 U.S.C. section 360bbb-3(b)(1), unless the authorization is terminated or revoked sooner. Performed at [facility], [place] [address] [place], [place] [address]          Radiology Studies: Ct Angio Chest Pe W And/or Wo Contrast  Result Date: 05/03/2019 CLINICAL DATA:  Acute abdominal pain. Epigastric  pain. Complex chest pain EXAM: CT ANGIOGRAPHY CHEST CT ABDOMEN AND PELVIS WITH CONTRAST TECHNIQUE: Multidetector CT imaging of the chest was performed using the standard protocol during bolus administration of intravenous contrast. Multiplanar CT image reconstructions and MIPs were obtained to evaluate the vascular anatomy. Multidetector CT imaging of the abdomen and pelvis was performed using the standard protocol during bolus administration of intravenous contrast. CONTRAST:  14mL OMNIPAQUE IOHEXOL 350 MG/ML SOLN COMPARISON:  None. FINDINGS: CTA CHEST FINDINGS Cardiovascular: Contrast injection is sufficient to demonstrate satisfactory opacification of the pulmonary arteries to the segmental level. There is no pulmonary embolus. The main pulmonary artery is dilated measuring approximately 3.6 cm. There is no CT evidence of acute right heart strain. The visualized aorta is normal. Heart size is normal, without pericardial effusion. Mediastinum/Nodes: --No mediastinal or hilar lymphadenopathy. --No axillary lymphadenopathy. --No supraclavicular lymphadenopathy. --Normal thyroid gland. --The esophagus is unremarkable Lungs/Pleura: There are multifocal airspace opacities bilaterally. The lung volumes are low. There is no pneumothorax. There are trace bilateral pleural effusions. Musculoskeletal: No chest wall abnormality. No acute or significant osseous findings. Review of the MIP images confirms the above findings. CT ABDOMEN and PELVIS FINDINGS Hepatobiliary: There is decreased hepatic attenuation suggestive of hepatic steatosis. Cholelithiasis without acute inflammation.There is no biliary ductal dilation. Pancreas: Normal contours without ductal dilatation. No peripancreatic fluid collection. Spleen: No splenic laceration or hematoma. Adrenals/Urinary Tract: --Adrenal glands: No adrenal hemorrhage. --Right kidney/ureter: No hydronephrosis or perinephric hematoma. --Left kidney/ureter: There is a nonobstructing  stone in the interpolar region of the left kidney. There is a small nonobstructing stone in the lower pole. --Urinary bladder: Unremarkable. Stomach/Bowel: --Stomach/Duodenum: No hiatal hernia or other gastric abnormality. Normal duodenal course and caliber. --Small bowel: No dilatation or inflammation. --Colon: No focal abnormality. --Appendix: Normal. Vascular/Lymphatic: Normal course and caliber of the major abdominal vessels. --No retroperitoneal lymphadenopathy. --No mesenteric lymphadenopathy. --No pelvic or inguinal lymphadenopathy. Reproductive: Status post hysterectomy. No adnexal mass. Other: No ascites or free air. The abdominal wall is normal. Musculoskeletal. No acute displaced fractures. Review of the MIP images confirms the above findings. IMPRESSION: 1. No acute aortic syndrome or pulmonary embolus. 2. Bilateral multifocal airspace opacities, concerning for multifocal pneumonia (viral or bacterial). 3. Trace bilateral pleural effusions. 4. Dilated main pulmonary artery, suggestive of pulmonary arterial hypertension. 5. Cholelithiasis without acute inflammation. 6. Nonobstructive left nephrolithiasis. 7. Hepatic steatosis. Electronically Signed   By: Constance Holster M.D.   On: 05/03/2019 04:21   Ct Abdomen Pelvis W Contrast  Result Date: 05/03/2019 CLINICAL DATA:  Acute abdominal pain. Epigastric pain. Complex chest pain EXAM: CT ANGIOGRAPHY  CHEST CT ABDOMEN AND PELVIS WITH CONTRAST TECHNIQUE: Multidetector CT imaging of the chest was performed using the standard protocol during bolus administration of intravenous contrast. Multiplanar CT image reconstructions and MIPs were obtained to evaluate the vascular anatomy. Multidetector CT imaging of the abdomen and pelvis was performed using the standard protocol during bolus administration of intravenous contrast. CONTRAST:  15mL OMNIPAQUE IOHEXOL 350 MG/ML SOLN COMPARISON:  None. FINDINGS: CTA CHEST FINDINGS Cardiovascular: Contrast injection is  sufficient to demonstrate satisfactory opacification of the pulmonary arteries to the segmental level. There is no pulmonary embolus. The main pulmonary artery is dilated measuring approximately 3.6 cm. There is no CT evidence of acute right heart strain. The visualized aorta is normal. Heart size is normal, without pericardial effusion. Mediastinum/Nodes: --No mediastinal or hilar lymphadenopathy. --No axillary lymphadenopathy. --No supraclavicular lymphadenopathy. --Normal thyroid gland. --The esophagus is unremarkable Lungs/Pleura: There are multifocal airspace opacities bilaterally. The lung volumes are low. There is no pneumothorax. There are trace bilateral pleural effusions. Musculoskeletal: No chest wall abnormality. No acute or significant osseous findings. Review of the MIP images confirms the above findings. CT ABDOMEN and PELVIS FINDINGS Hepatobiliary: There is decreased hepatic attenuation suggestive of hepatic steatosis. Cholelithiasis without acute inflammation.There is no biliary ductal dilation. Pancreas: Normal contours without ductal dilatation. No peripancreatic fluid collection. Spleen: No splenic laceration or hematoma. Adrenals/Urinary Tract: --Adrenal glands: No adrenal hemorrhage. --Right kidney/ureter: No hydronephrosis or perinephric hematoma. --Left kidney/ureter: There is a nonobstructing stone in the interpolar region of the left kidney. There is a small nonobstructing stone in the lower pole. --Urinary bladder: Unremarkable. Stomach/Bowel: --Stomach/Duodenum: No hiatal hernia or other gastric abnormality. Normal duodenal course and caliber. --Small bowel: No dilatation or inflammation. --Colon: No focal abnormality. --Appendix: Normal. Vascular/Lymphatic: Normal course and caliber of the major abdominal vessels. --No retroperitoneal lymphadenopathy. --No mesenteric lymphadenopathy. --No pelvic or inguinal lymphadenopathy. Reproductive: Status post hysterectomy. No adnexal mass. Other:  No ascites or free air. The abdominal wall is normal. Musculoskeletal. No acute displaced fractures. Review of the MIP images confirms the above findings. IMPRESSION: 1. No acute aortic syndrome or pulmonary embolus. 2. Bilateral multifocal airspace opacities, concerning for multifocal pneumonia (viral or bacterial). 3. Trace bilateral pleural effusions. 4. Dilated main pulmonary artery, suggestive of pulmonary arterial hypertension. 5. Cholelithiasis without acute inflammation. 6. Nonobstructive left nephrolithiasis. 7. Hepatic steatosis. Electronically Signed   By: Constance Holster M.D.   On: 05/03/2019 04:21   Dg Chest Portable 1 View  Result Date: 05/03/2019 CLINICAL DATA:  Chest pain EXAM: PORTABLE CHEST 1 VIEW COMPARISON:  None. FINDINGS: The heart size and mediastinal contours are within normal limits. Both lungs are clear. The visualized skeletal structures are unremarkable. IMPRESSION: No active disease. Electronically Signed   By: Constance Holster M.D.   On: 05/03/2019 03:06   US Abdomen Limited Ruq  Result Date: 05/03/2019 CLINICAL DATA:  Epigastric pain EXAM: ULTRASOUND ABDOMEN LIMITED RIGHT UPPER QUADRANT COMPARISON:  CT 05/03/2019 FINDINGS: Gallbladder: Multiple intraluminal stones measuring up to 1.3 cm. Difficult visualization due to habitus. Positive sonographic Murphy. Wall thickness slightly increased at 3.5 mm. Common bile duct: Diameter: 3.8 mm Liver: Echogenic. Suspicion of subtle contour nodularity. No gross focal hepatic abnormality. Portal vein is patent on color Doppler imaging with normal direction of blood flow towards the liver. Other: None. IMPRESSION: 1. Difficult visualization of gallbladder due to habitus. Multiple gallstones with slightly thickened wall and positive sonographic Percell Miller, raising concern for cholecystitis 2. Echogenic liver consistent with steatosis and or hepatocellular disease. Suspicion of  subtle contour nodularity as may be seen with early changes of  cirrhosis Electronically Signed   By: Donavan Foil M.D.   On: 05/03/2019 19:16        Scheduled Meds:  atorvastatin  10 mg Oral Daily   [START ON 05/05/2019] dexamethasone (DECADRON) injection  6 mg Intravenous Daily   enoxaparin (LOVENOX) injection  40 mg Subcutaneous Q12H   insulin aspart  0-20 Units Subcutaneous TID WC   insulin aspart  0-5 Units Subcutaneous QHS   insulin glargine  10 Units Subcutaneous Daily   linagliptin  5 mg Oral Daily   vitamin C  500 mg Oral Daily   zinc sulfate  220 mg Oral Daily   Continuous Infusions:  cefTRIAXone (ROCEPHIN)  IV     [START ON 05/05/2019] remdesivir 100 mg in NS 250 mL     sodium chloride       LOS: 1 day    Time spent: 25 minutes      Edwin Dada, MD [facility] 05/04/2019, 4:28 PM     Please page through [place]:  [email or web address] Contact charge nurse for password If 7PM-7AM, please contact night-coverage

## 2019-05-04 NOTE — Progress Notes (Addendum)
Brief HPI:  Patient presenting with c/o chest pain and shortness of breath with nonproductive cough ongoing for last 3 days and abdominal pain. (See detailed HPI). Found to have multifocal pneumonia secondary to Covid 19 illness.  She also has acute cholecystitis on US abdomen.   Discussed with both [facility] general surgery Dr. Lysle Pearl and [facility] Dr. Marlou Starks, per their recommendation patient will likely not have surgery in the setting of Covid-19 illness. They recommend medical management in the interim. Patient is currently on the wait list to be transferred to [facility] once bed becomes available.   Plan - Will start her on Remdesivir - Continue Steroids as ordered - Check daily CBC, CMP, Mag, Phosphorus, CRP, Ferritin, D-Dimer  - ID consult placed - Continue management per admitting - Consider consulting general surgery for medical management of acute cholecystitis   Rufina Falco, DNP, CCRN, FNP-C [facility] Nurse Practitioner Between 7am to 6pm - Pager [phone]  After 6pm go to [email or web address] - Proofreader  [facility] [employer]  [phone]

## 2019-05-05 LAB — COMPREHENSIVE METABOLIC PANEL
ALT: 30 U/L (ref 0–44)
AST: 24 U/L (ref 15–41)
Albumin: 3.6 g/dL (ref 3.5–5.0)
Alkaline Phosphatase: 55 U/L (ref 38–126)
Anion gap: 11 (ref 5–15)
BUN: 18 mg/dL (ref 6–20)
CO2: 24 mmol/L (ref 22–32)
Calcium: 8.7 mg/dL — ABNORMAL LOW (ref 8.9–10.3)
Chloride: 104 mmol/L (ref 98–111)
Creatinine, Ser: 0.64 mg/dL (ref 0.44–1.00)
GFR calc Af Amer: >60 mL/min (ref >60)
GFR calc non Af Amer: >60 mL/min (ref >60)
Glucose, Bld: 196 mg/dL — ABNORMAL HIGH (ref 70–99)
Potassium: 3.5 mmol/L (ref 3.5–5.1)
Sodium: 139 mmol/L (ref 135–145)
Total Bilirubin: 0.8 mg/dL (ref 0.3–1.2)
Total Protein: 6.5 g/dL (ref 6.5–8.1)

## 2019-05-05 LAB — GLUCOSE, CAPILLARY
Glucose-Capillary: 181 mg/dL — ABNORMAL HIGH (ref 70–99)
Glucose-Capillary: 232 mg/dL — ABNORMAL HIGH (ref 70–99)
Glucose-Capillary: 324 mg/dL — ABNORMAL HIGH (ref 70–99)
Glucose-Capillary: 324 mg/dL — ABNORMAL HIGH (ref 70–99)

## 2019-05-05 LAB — CBC
HCT: 43.3 % (ref 36.0–46.0)
Hemoglobin: 14.1 g/dL (ref 12.0–15.0)
MCH: 28.5 pg (ref 26.0–34.0)
MCHC: 32.6 g/dL (ref 30.0–36.0)
MCV: 87.7 fL (ref 80.0–100.0)
Platelets: 183 10*3/uL (ref 150–400)
RBC: 4.94 MIL/uL (ref 3.87–5.11)
RDW: 13.5 % (ref 11.5–15.5)
WBC: 7.4 10*3/uL (ref 4.0–10.5)
nRBC: 0 % (ref 0.0–0.2)

## 2019-05-05 LAB — MAGNESIUM: Magnesium: 2.1 mg/dL (ref 1.7–2.4)

## 2019-05-05 MED ORDER — SODIUM CHLORIDE 0.9 % IV SOLN
INTRAVENOUS | Status: DC | PRN
  Administered 2019-05-05: 250 mL via INTRAVENOUS

## 2019-05-05 NOTE — Progress Notes (Signed)
PROGRESS NOTE    Grace Flores  [id number] DOB: April 01, 1966 DOA: 05/03/2019 PCP: Idelle Crouch, MD      Brief Narrative:  Grace Flores is a 53 y.o. F with HTN, DM, kidney stones, and psoriasis not on meds who presented with flu-like symptoms, diarrhea, abdominal pain/cramps, and SOB/cough for 3 days.  In the ER, required 4L O2 by [place].  CTA chest showed multifocal pneumonia, no PE.  CT abdomen showed gallstones.  COVID+.  Started on remdesivir and steroids.           Assessment & Plan:  Coronavirus pneumonitis, moderate COVID In the setting of the ongoing 2020 COVID-19 pandemic.  CT chest showed extensive multifocal pneumonia.  SpO2 <94%.   Inflammatory markers reassuring  Weaned to 1L, RR normal. -Continue remdesivir, day 2 of 5 -Continue Decadron, day 3 of 10    -Continue VTE PPx with Lovenox -Continue Zinc and Vitamin C -Flutter valve, turn, cough, incentive spirometry q2hrs while awake     Acute cholecystitis, possible vs biliary colic alone LFTs normal, WBC normal.  Clinically she appears well, but US abdomen on admission suggested "thickened wall and positive sonographic Percell Miller, raising concern for cholecystitis" -Stop CTX -Advance diet as tolerated -If worsening pain, vomiting, leukocytosis or fever, will obtain HIDA and consider perc drain, otherwise, I think this militates twoards just symptomatic biliary colic  Diabetes Glucoses improved this morning -Continue Lantus -Continue SS correction insulin -Continue linagliptin  -Continue atorvastatin -Hold Actos and glimepiride  Hypertension Not on meds at baseline, BP normal here       MDM and disposition: The below labs and imaging reports reviewed and summarized above.  Medication management as above.    The patient was admitted with COVID-19.  There is a possibility she is she has some cholecystitis, so for that she will remain here in case she would deteriorate and need IR  percutaneous drainage.        DVT prophylaxis: Lovenox Code Status: FULL Family Communication:      Procedures:   11/4 CTA chest  -- no PE noted, positive for multifocal pneumonia   11/4 CT abdomen and pelvis -- thickened GB  11/5 US abdomen -- GB stones, thickened GB, murphy's sign  Antimicrobials:   Ceftriaxone 11/4 >> 11/7  Zithromax 11/4>>11/5  Remdesivir 11/6 >>   Culture data:  None     Subjective: No abdominal pain.  She is doing fairly well with eating clears.  No vomiting, nausea.  No fever, confusion, chest discomfort.  No shortness of breath.  Still has some severe cough.     Objective: Vitals:   05/04/19 1710 05/04/19 2226 05/04/19 2229 05/05/19 0938  BP:  (!) 88/54 108/66 115/67  Pulse:  81 70 68  Resp:    18  Temp:  98.3 F (36.8 C)  98.5 F (36.9 C)  TempSrc:  Oral  Oral  SpO2: 97% 100% 98% 96%  Weight:      Height:        Intake/Output Summary (Last 24 hours) at 05/05/2019 1510 Last data filed at 05/05/2019 1134 Gross per 24 hour  Intake 571.19 ml  Output -  Net 571.19 ml   Filed Weights   05/03/19 0208 05/04/19 0029 05/04/19 0439  Weight: (!) 147.4 kg (!) 147.4 kg (!) 147.4 kg    Examination: General appearance: Well-nourished adult female, alert and in no acute distress.  Sitting in recliner. HEENT: Anicteric, conjunctiva pink, lids and lashes normal. No nasal  deformity, discharge, epistaxis.  Lips moist, teeth normal. OP treated, no oral lesions.   Skin: Warm and dry.  No suspicious rashes or lesions. Cardiac: RRR, no murmurs appreciated.  JVP not visible, no LE edema.    Respiratory: Normal respiratory rate and rhythm.  CTAB without rales or wheezes. Abdomen: Abdomen soft.  No TTP or guarding. No ascites, distension, hepatosplenomegaly.   MSK: No deformities or effusions of the large joints of the upper or lower extremities bilaterally. Neuro: Awake and alert. Naming is grossly intact, and the patient's recall, recent and  remote, as well as general fund of knowledge seem within normal limits.  Muscle tone normal, without fasciculations.  Moves all extremities equally and with normal coordination.  Marland Kitchen Speech fluent.    Psych: Sensorium intact and responding to questions, attention normal. Affect normal.  Judgment and insight appear normal.     Data Reviewed: I have personally reviewed following labs and imaging studies:  CBC: Recent Labs  Lab 05/03/19 0214 05/03/19 0751 05/04/19 0413 05/05/19 0649  WBC 4.1 3.6* 2.9* 7.4  NEUTROABS 1.8 1.7  --   --   HGB 14.3 14.3 14.6 14.1  HCT 43.8 44.5 45.4 43.3  MCV 88.1 89.0 89.2 87.7  PLT 152 156 149* [id number]   Basic Metabolic Panel: Recent Labs  Lab 05/03/19 0214 05/03/19 0751 05/04/19 0413 05/05/19 0649  NA 138 139 139 139  K 3.8 3.8 4.1 3.5  CL 107 105 105 104  CO2 20* 26 26 24   GLUCOSE 265* 188* 306* 196*  BUN 11 9 10 18   CREATININE 0.72 0.59 0.71 0.64  CALCIUM 9.0 8.6* 8.7* 8.7*  MG  --   --   --  2.1   GFR: Estimated Creatinine Clearance: 124.1 mL/min (by C-G formula based on SCr of 0.64 mg/dL). Liver Function Tests: Recent Labs  Lab 05/03/19 0214 05/03/19 0751 05/04/19 0413 05/05/19 0649  AST 37 29 27 24   ALT 36 35 37 30  ALKPHOS 68 71 66 55  BILITOT 0.6 0.6 0.7 0.8  PROT 6.7 7.0 7.1 6.5  ALBUMIN 3.7 3.9 3.8 3.6   Recent Labs  Lab 05/03/19 0214 05/03/19 0751  LIPASE 27 23   No results for input(s): AMMONIA in the last 168 hours. Coagulation Profile: No results for input(s): INR, PROTIME in the last 168 hours. Cardiac Enzymes: No results for input(s): CKTOTAL, CKMB, CKMBINDEX, TROPONINI in the last 168 hours. BNP (last 3 results) No results for input(s): PROBNP in the last 8760 hours. HbA1C: Recent Labs    05/04/19 0413  HGBA1C 9.1*   CBG: Recent Labs  Lab 05/04/19 1233 05/04/19 1637 05/04/19 2215 05/05/19 0806 05/05/19 1143  GLUCAP 280* 324* 219* 181* 232*   Lipid Profile: No results for input(s): CHOL, HDL,  LDLCALC, TRIG, CHOLHDL, LDLDIRECT in the last 72 hours. Thyroid Function Tests: No results for input(s): TSH, T4TOTAL, FREET4, T3FREE, THYROIDAB in the last 72 hours. Anemia Panel: Recent Labs    05/04/19 0101  FERRITIN 217   Urine analysis:    Component Value Date/Time   COLORURINE YELLOW (A) 05/03/2019 0214   APPEARANCEUR CLEAR (A) 05/03/2019 0214   LABSPEC 1.017 05/03/2019 0214   PHURINE 5.0 05/03/2019 0214   GLUCOSEU >=500 (A) 05/03/2019 0214   HGBUR NEGATIVE 05/03/2019 0214   BILIRUBINUR NEGATIVE 05/03/2019 0214   KETONESUR NEGATIVE 05/03/2019 0214   PROTEINUR NEGATIVE 05/03/2019 0214   NITRITE NEGATIVE 05/03/2019 0214   LEUKOCYTESUR NEGATIVE 05/03/2019 0214   Sepsis Labs: @LABRCNTIP (procalcitonin:4,lacticacidven:4)  )  Recent Results (from the past 240 hour(s))  SARS CORONAVIRUS 2 (TAT 6-24 HRS) Nasopharyngeal Nasopharyngeal Swab     Status: Abnormal   Collection Time: 05/03/19  4:41 AM   Specimen: Nasopharyngeal Swab  Result Value Ref Range Status   SARS Coronavirus 2 POSITIVE (A) NEGATIVE Final    Comment: RESULT CALLED TO, READ BACK BY AND VERIFIED WITH: S.JONES,RN [id number] I.MANNING (NOTE) SARS-CoV-2 target nucleic acids are DETECTED. The SARS-CoV-2 RNA is generally detectable in upper and lower respiratory specimens during the acute phase of infection. Positive results are indicative of active infection with SARS-CoV-2. Clinical  correlation with patient history and other diagnostic information is necessary to determine patient infection status. Positive results do  not rule out bacterial infection or co-infection with other viruses. The expected result is Negative. Fact Sheet for Patients: SugarRoll.be Fact Sheet for Healthcare Providers: https://www.woods-mathews.com/ This test is not yet approved or cleared by the [place] FDA and  has been authorized for detection and/or diagnosis of SARS-CoV-2 by FDA  under an Emergency Use Authorization (EUA). This EUA will remain  in effect (meaning this test can be used) for the  duration of the COVID-19 declaration under Section 564(b)(1) of the Act, 21 U.S.C. section 360bbb-3(b)(1), unless the authorization is terminated or revoked sooner. Performed at [facility], [place] [address], [place], [place] [address]          Radiology Studies: US Abdomen Limited Ruq  Result Date: 05/03/2019 CLINICAL DATA:  Epigastric pain EXAM: ULTRASOUND ABDOMEN LIMITED RIGHT UPPER QUADRANT COMPARISON:  CT 05/03/2019 FINDINGS: Gallbladder: Multiple intraluminal stones measuring up to 1.3 cm. Difficult visualization due to habitus. Positive sonographic Murphy. Wall thickness slightly increased at 3.5 mm. Common bile duct: Diameter: 3.8 mm Liver: Echogenic. Suspicion of subtle contour nodularity. No gross focal hepatic abnormality. Portal vein is patent on color Doppler imaging with normal direction of blood flow towards the liver. Other: None. IMPRESSION: 1. Difficult visualization of gallbladder due to habitus. Multiple gallstones with slightly thickened wall and positive sonographic Percell Miller, raising concern for cholecystitis 2. Echogenic liver consistent with steatosis and or hepatocellular disease. Suspicion of subtle contour nodularity as may be seen with early changes of cirrhosis Electronically Signed   By: Donavan Foil M.D.   On: 05/03/2019 19:16        Scheduled Meds: . atorvastatin  10 mg Oral Daily  . dexamethasone (DECADRON) injection  6 mg Intravenous Daily  . enoxaparin (LOVENOX) injection  40 mg Subcutaneous Q12H  . insulin aspart  0-20 Units Subcutaneous TID WC  . insulin aspart  0-5 Units Subcutaneous QHS  . insulin glargine  10 Units Subcutaneous Daily  . linagliptin  5 mg Oral Daily  . vitamin C  500 mg Oral Daily  . zinc sulfate  220 mg Oral Daily   Continuous Infusions: . sodium chloride Stopped (05/05/19 0951)  . remdesivir 100 mg in  NS 250 mL Stopped (05/05/19 1037)  . sodium chloride       LOS: 2 days    Time spent: 25 minutes      Edwin Dada, MD [facility] 05/05/2019, 3:10 PM     Please page through [place]:  [email or web address] Contact charge nurse for password If 7PM-7AM, please contact night-coverage

## 2019-05-05 NOTE — Plan of Care (Signed)
  Problem: Education: Goal: Knowledge of General Education information will improve Description: Including pain rating scale, medication(s)/side effects and non-pharmacologic comfort measures Outcome: Progressing   Problem: Health Behavior/Discharge Planning: Goal: Ability to manage health-related needs will improve Outcome: Progressing   Problem: Clinical Measurements: Goal: Diagnostic test results will improve Outcome: Progressing Goal: Respiratory complications will improve Outcome: Progressing   Problem: Activity: Goal: Risk for activity intolerance will decrease Outcome: Progressing   Problem: Nutrition: Goal: Adequate nutrition will be maintained Outcome: Progressing   Problem: Safety: Goal: Ability to remain free from injury will improve Outcome: Progressing   Problem: Skin Integrity: Goal: Risk for impaired skin integrity will decrease Outcome: Progressing   Problem: Education: Goal: Knowledge of risk factors and measures for prevention of condition will improve Outcome: Progressing   Problem: Coping: Goal: Psychosocial and spiritual needs will be supported Outcome: Progressing   Problem: Respiratory: Goal: Will maintain a patent airway Outcome: Progressing Goal: Complications related to the disease process, condition or treatment will be avoided or minimized Outcome: Progressing

## 2019-05-05 NOTE — Progress Notes (Addendum)
MD at patient bedside. No complaints at this time.   Update 1515: Orders from MD to advance diet as tolerated. Patient has tolerated clear liquids well and has not complained of pain. Would like to try soft diet. Soft diet put in, will continue to monitor. No complaints at this time.  Update 1850: patient tolerated soft diet well, no nausea or vomiting.

## 2019-05-05 NOTE — Plan of Care (Signed)
  Problem: Clinical Measurements: Goal: Will remain free from infection Outcome: Progressing Goal: Respiratory complications will improve Outcome: Progressing   Problem: Nutrition: Goal: Adequate nutrition will be maintained Outcome: Not Progressing  Clear liquid diet

## 2019-05-06 LAB — COMPREHENSIVE METABOLIC PANEL
ALT: 21 U/L (ref 0–44)
AST: 16 U/L (ref 15–41)
Albumin: 3.4 g/dL — ABNORMAL LOW (ref 3.5–5.0)
Alkaline Phosphatase: 52 U/L (ref 38–126)
Anion gap: 8 (ref 5–15)
BUN: 18 mg/dL (ref 6–20)
CO2: 25 mmol/L (ref 22–32)
Calcium: 8.5 mg/dL — ABNORMAL LOW (ref 8.9–10.3)
Chloride: 106 mmol/L (ref 98–111)
Creatinine, Ser: 0.59 mg/dL (ref 0.44–1.00)
GFR calc Af Amer: >60 mL/min (ref >60)
GFR calc non Af Amer: >60 mL/min (ref >60)
Glucose, Bld: 218 mg/dL — ABNORMAL HIGH (ref 70–99)
Potassium: 3.5 mmol/L (ref 3.5–5.1)
Sodium: 139 mmol/L (ref 135–145)
Total Bilirubin: 0.7 mg/dL (ref 0.3–1.2)
Total Protein: 6 g/dL — ABNORMAL LOW (ref 6.5–8.1)

## 2019-05-06 LAB — GLUCOSE, CAPILLARY
Glucose-Capillary: 194 mg/dL — ABNORMAL HIGH (ref 70–99)
Glucose-Capillary: 279 mg/dL — ABNORMAL HIGH (ref 70–99)

## 2019-05-06 LAB — MAGNESIUM: Magnesium: 2.3 mg/dL (ref 1.7–2.4)

## 2019-05-06 NOTE — Plan of Care (Signed)
Rounded with MD, Pt up in the chair eating breakfast, ambulated her in room O2 sats remained at 95% on r/a. No complains of SOB. Will continue to monitor.  Problem: Education: Goal: Knowledge of General Education information will improve Description: Including pain rating scale, medication(s)/side effects and non-pharmacologic comfort measures Outcome: Progressing   Problem: Health Behavior/Discharge Planning: Goal: Ability to manage health-related needs will improve Outcome: Progressing   Problem: Clinical Measurements: Goal: Ability to maintain clinical measurements within normal limits will improve Outcome: Progressing Goal: Will remain free from infection Outcome: Progressing   Problem: Activity: Goal: Risk for activity intolerance will decrease Outcome: Progressing   Problem: Respiratory: Goal: Will maintain a patent airway Outcome: Progressing

## 2019-05-06 NOTE — Progress Notes (Signed)
SATURATION QUALIFICATIONS: (This note is used to comply with regulatory documentation for home oxygen) ? ?Patient Saturations on Room Air at Rest = 94% ? ?Patient Saturations on Room Air while Ambulating = 95% ? ?Patient Saturations on n/a Liters of oxygen while Ambulating = n/a % ? ?Please briefly explain why patient needs home oxygen: ?

## 2019-05-06 NOTE — Progress Notes (Signed)
[place] to be D/C'd Home per MD order.  Discussed prescriptions and follow up appointments with the patient. Prescriptions given to patient, medication list explained in detail. Pt verbalized understanding. Educated on the importance to remain under quarantine until 05/24/2019.  Allergies as of 05/06/2019      Reactions   Tape Other (See Comments)   Blistering skin and skin comes off      Medication List    TAKE these medications   atorvastatin 10 MG tablet Commonly known as: LIPITOR Take 1 tablet by mouth daily.   glimepiride 4 MG tablet Commonly known as: AMARYL Take 4 mg by mouth 2 (two) times daily.   meloxicam 7.5 MG tablet Commonly known as: MOBIC Take 7.5 mg by mouth daily.   pioglitazone 30 MG tablet Commonly known as: ACTOS Take 30 mg by mouth daily.       Vitals:   05/06/19 0746 05/06/19 1305  BP: 122/77   Pulse: (!) 58 70  Resp: 19   Temp: 98.1 F (36.7 C)   SpO2: 94% 94%    Tele box removed and returned. Skin clean, dry and intact without evidence of skin break down, no evidence of skin tears noted. IV catheter discontinued intact. Site without signs and symptoms of complications. Dressing and pressure applied. Pt denies pain at this time. No complaints noted.  An After Visit Summary was printed and given to the patient. Patient escorted via [place], and D/C home via private auto.  Rolley Sims

## 2019-05-06 NOTE — Discharge Summary (Signed)
Physician Discharge Summary  Grace Flores [id number] DOB: [birth year] DOA: 05/03/2019  PCP: Idelle Crouch, MD  Admit date: 05/03/2019 Discharge date: 05/06/2019  Admitted From: Home  Disposition:  Home   Recommendations for Outpatient Follow-up:  1. Follow up with PCP in 1-2 weeks 2. Please obtain BMP and LFTs in 3-4 weeks to confirm electrolytes and transaminases returned to normal 3. Dr. Doy Hutching: Please review RUQ pain pattern, if continued biliary colic, assist patient with surgery referral        Home Health: None  Equipment/Devices: Pulse ox  Discharge Condition: Good  CODE STATUS: FULL Diet recommendation: Diabetic  Brief/Interim Summary: Mrs. Grace Flores is a 53 y.o. F with HTN, DM, kidney stones, and psoriasis not on meds who presented with flu-like symptoms, diarrhea, abdominal pain/cramps, and SOB/cough for 3 days.  In the ER, required 4L O2 by [place].  CTA chest showed multifocal pneumonia, no PE.  CT abdomen showed gallstones.  COVID+.  Started on remdesivir and steroids.            PRINCIPAL HOSPITAL DIAGNOSIS: COVID    Discharge Diagnoses:    Coronavirus pneumonitis, moderate COVID In the setting of the ongoing 2020 COVID-19 pandemic.  CT chest showed extensive multifocal pneumonia.  SpO2 <94%.   Inflammatory markers reassuring  Weaned to 1L, RR normal. -Continue remdesivir, day 2 of 5 -Continue Decadron, day 3 of 10    -Continue VTE PPx with Lovenox -Continue Zinc and Vitamin C -Flutter valve, turn, cough, incentive spirometry q2hrs while awake     Biliary colic, suspected Had epigastric pain and scapular pain and nausea at admission.  CT showed gallstones and US abdomen follow up showed "thickened [gallbladder] wall and positive sonographic Percell Miller, raising concern for cholecystitis".    She was given clears/bowel rest and monitored with serial exams and had no further pain, LFTs normal, WBC remained normal.     Clinically she appeared well, and her benign clinical course and tolerance of advancing her diet militated strongly against cholecystitis.    Diabetes  Hypertension Not on meds at baseline, BP normal here             Discharge Instructions  Discharge Instructions    Discharge instructions   Complete by: As directed    From Dr. Loleta Books: You were admitted for coronavirus (Also known as COVID-19)  You were treated with an anti-virus medicine ("remdesivir") and an anti-inflammatory (a "steroid") while you were here.  You did extremely well while you were here, and were weaned completely off oxygen so that we were able to stop the remdesivir.  If you have any lingering cough, you should take the cough syrup we gave you here, Robitussin (with the ingredients "GUIAFENESIN" and "DEXTROMETHORPHAN")  In order to monitor yourself when you go home, you should purchase a pulse oximeter at your pharmacy. This is a device that you put on your finger to measure your oxygen level.  They are available at any pharmacy. Use it to check your oxygen level twice daily until you see your primary care doctor. If your oxygen level is ever LESS than 88% and doesn't get better even after 5 minutes of sitting still, you should call your primary care doctor immediately or come back to the ER.   HOW LONG TO REMAIN IN QUARANTINE: There is no absolutely correct answer to this and so our best answer is to be on the cautious side.  Based on what we know of the virus, you should isolate strictly  until 21 days from your first symptoms.  Until you end your quarantine: If you have anyone in the home who has NOT had coronavirus:    -do not be in the same room with them until your self isolation is over    -wear a mask and have them wear a mask if you MUST be in the same room    -clean all hard surfaces (counters, doors, tables) twice a day    -use a separate bathroom at all times   Increase activity  slowly   Complete by: As directed      Allergies as of 05/06/2019      Reactions   Tape Other (See Comments)   Blistering skin and skin comes off      Medication List    TAKE these medications   atorvastatin 10 MG tablet Commonly known as: LIPITOR Take 1 tablet by mouth daily.   glimepiride 4 MG tablet Commonly known as: AMARYL Take 4 mg by mouth 2 (two) times daily.   meloxicam 7.5 MG tablet Commonly known as: MOBIC Take 7.5 mg by mouth daily.   pioglitazone 30 MG tablet Commonly known as: ACTOS Take 30 mg by mouth daily.       Allergies  Allergen Reactions  . Tape Other (See Comments)    Blistering skin and skin comes off    Consultations:  ID   Procedures/Studies: Ct Angio Chest Pe W And/or Wo Contrast  Result Date: 05/03/2019 CLINICAL DATA:  Acute abdominal pain. Epigastric pain. Complex chest pain EXAM: CT ANGIOGRAPHY CHEST CT ABDOMEN AND PELVIS WITH CONTRAST TECHNIQUE: Multidetector CT imaging of the chest was performed using the standard protocol during bolus administration of intravenous contrast. Multiplanar CT image reconstructions and MIPs were obtained to evaluate the vascular anatomy. Multidetector CT imaging of the abdomen and pelvis was performed using the standard protocol during bolus administration of intravenous contrast. CONTRAST:  160mL OMNIPAQUE IOHEXOL 350 MG/ML SOLN COMPARISON:  None. FINDINGS: CTA CHEST FINDINGS Cardiovascular: Contrast injection is sufficient to demonstrate satisfactory opacification of the pulmonary arteries to the segmental level. There is no pulmonary embolus. The main pulmonary artery is dilated measuring approximately 3.6 cm. There is no CT evidence of acute right heart strain. The visualized aorta is normal. Heart size is normal, without pericardial effusion. Mediastinum/Nodes: --No mediastinal or hilar lymphadenopathy. --No axillary lymphadenopathy. --No supraclavicular lymphadenopathy. --Normal thyroid gland. --The  esophagus is unremarkable Lungs/Pleura: There are multifocal airspace opacities bilaterally. The lung volumes are low. There is no pneumothorax. There are trace bilateral pleural effusions. Musculoskeletal: No chest wall abnormality. No acute or significant osseous findings. Review of the MIP images confirms the above findings. CT ABDOMEN and PELVIS FINDINGS Hepatobiliary: There is decreased hepatic attenuation suggestive of hepatic steatosis. Cholelithiasis without acute inflammation.There is no biliary ductal dilation. Pancreas: Normal contours without ductal dilatation. No peripancreatic fluid collection. Spleen: No splenic laceration or hematoma. Adrenals/Urinary Tract: --Adrenal glands: No adrenal hemorrhage. --Right kidney/ureter: No hydronephrosis or perinephric hematoma. --Left kidney/ureter: There is a nonobstructing stone in the interpolar region of the left kidney. There is a small nonobstructing stone in the lower pole. --Urinary bladder: Unremarkable. Stomach/Bowel: --Stomach/Duodenum: No hiatal hernia or other gastric abnormality. Normal duodenal course and caliber. --Small bowel: No dilatation or inflammation. --Colon: No focal abnormality. --Appendix: Normal. Vascular/Lymphatic: Normal course and caliber of the major abdominal vessels. --No retroperitoneal lymphadenopathy. --No mesenteric lymphadenopathy. --No pelvic or inguinal lymphadenopathy. Reproductive: Status post hysterectomy. No adnexal mass. Other: No ascites or free air. The  abdominal wall is normal. Musculoskeletal. No acute displaced fractures. Review of the MIP images confirms the above findings. IMPRESSION: 1. No acute aortic syndrome or pulmonary embolus. 2. Bilateral multifocal airspace opacities, concerning for multifocal pneumonia (viral or bacterial). 3. Trace bilateral pleural effusions. 4. Dilated main pulmonary artery, suggestive of pulmonary arterial hypertension. 5. Cholelithiasis without acute inflammation. 6. Nonobstructive  left nephrolithiasis. 7. Hepatic steatosis. Electronically Signed   By: Constance Holster M.D.   On: 05/03/2019 04:21   Ct Abdomen Pelvis W Contrast  Result Date: 05/03/2019 CLINICAL DATA:  Acute abdominal pain. Epigastric pain. Complex chest pain EXAM: CT ANGIOGRAPHY CHEST CT ABDOMEN AND PELVIS WITH CONTRAST TECHNIQUE: Multidetector CT imaging of the chest was performed using the standard protocol during bolus administration of intravenous contrast. Multiplanar CT image reconstructions and MIPs were obtained to evaluate the vascular anatomy. Multidetector CT imaging of the abdomen and pelvis was performed using the standard protocol during bolus administration of intravenous contrast. CONTRAST:  167mL OMNIPAQUE IOHEXOL 350 MG/ML SOLN COMPARISON:  None. FINDINGS: CTA CHEST FINDINGS Cardiovascular: Contrast injection is sufficient to demonstrate satisfactory opacification of the pulmonary arteries to the segmental level. There is no pulmonary embolus. The main pulmonary artery is dilated measuring approximately 3.6 cm. There is no CT evidence of acute right heart strain. The visualized aorta is normal. Heart size is normal, without pericardial effusion. Mediastinum/Nodes: --No mediastinal or hilar lymphadenopathy. --No axillary lymphadenopathy. --No supraclavicular lymphadenopathy. --Normal thyroid gland. --The esophagus is unremarkable Lungs/Pleura: There are multifocal airspace opacities bilaterally. The lung volumes are low. There is no pneumothorax. There are trace bilateral pleural effusions. Musculoskeletal: No chest wall abnormality. No acute or significant osseous findings. Review of the MIP images confirms the above findings. CT ABDOMEN and PELVIS FINDINGS Hepatobiliary: There is decreased hepatic attenuation suggestive of hepatic steatosis. Cholelithiasis without acute inflammation.There is no biliary ductal dilation. Pancreas: Normal contours without ductal dilatation. No peripancreatic fluid  collection. Spleen: No splenic laceration or hematoma. Adrenals/Urinary Tract: --Adrenal glands: No adrenal hemorrhage. --Right kidney/ureter: No hydronephrosis or perinephric hematoma. --Left kidney/ureter: There is a nonobstructing stone in the interpolar region of the left kidney. There is a small nonobstructing stone in the lower pole. --Urinary bladder: Unremarkable. Stomach/Bowel: --Stomach/Duodenum: No hiatal hernia or other gastric abnormality. Normal duodenal course and caliber. --Small bowel: No dilatation or inflammation. --Colon: No focal abnormality. --Appendix: Normal. Vascular/Lymphatic: Normal course and caliber of the major abdominal vessels. --No retroperitoneal lymphadenopathy. --No mesenteric lymphadenopathy. --No pelvic or inguinal lymphadenopathy. Reproductive: Status post hysterectomy. No adnexal mass. Other: No ascites or free air. The abdominal wall is normal. Musculoskeletal. No acute displaced fractures. Review of the MIP images confirms the above findings. IMPRESSION: 1. No acute aortic syndrome or pulmonary embolus. 2. Bilateral multifocal airspace opacities, concerning for multifocal pneumonia (viral or bacterial). 3. Trace bilateral pleural effusions. 4. Dilated main pulmonary artery, suggestive of pulmonary arterial hypertension. 5. Cholelithiasis without acute inflammation. 6. Nonobstructive left nephrolithiasis. 7. Hepatic steatosis. Electronically Signed   By: Constance Holster M.D.   On: 05/03/2019 04:21   Dg Chest Portable 1 View  Result Date: 05/03/2019 CLINICAL DATA:  Chest pain EXAM: PORTABLE CHEST 1 VIEW COMPARISON:  None. FINDINGS: The heart size and mediastinal contours are within normal limits. Both lungs are clear. The visualized skeletal structures are unremarkable. IMPRESSION: No active disease. Electronically Signed   By: Constance Holster M.D.   On: 05/03/2019 03:06   US Abdomen Limited Ruq  Result Date: 05/03/2019 CLINICAL DATA:  Epigastric pain EXAM:  ULTRASOUND  ABDOMEN LIMITED RIGHT UPPER QUADRANT COMPARISON:  CT 05/03/2019 FINDINGS: Gallbladder: Multiple intraluminal stones measuring up to 1.3 cm. Difficult visualization due to habitus. Positive sonographic Murphy. Wall thickness slightly increased at 3.5 mm. Common bile duct: Diameter: 3.8 mm Liver: Echogenic. Suspicion of subtle contour nodularity. No gross focal hepatic abnormality. Portal vein is patent on color Doppler imaging with normal direction of blood flow towards the liver. Other: None. IMPRESSION: 1. Difficult visualization of gallbladder due to habitus. Multiple gallstones with slightly thickened wall and positive sonographic Percell Miller, raising concern for cholecystitis 2. Echogenic liver consistent with steatosis and or hepatocellular disease. Suspicion of subtle contour nodularity as may be seen with early changes of cirrhosis Electronically Signed   By: Donavan Foil M.D.   On: 05/03/2019 19:16       Subjective: Feeling better.  Fatigue, but no dyspnea, chest pain.  No dizziness.  She has mild cough. Able to ambulate room with SpO2 95% and no symptoms.   Discharge Exam: Vitals:   05/06/19 0746 05/06/19 1305  BP: 122/77   Pulse: (!) 58 70  Resp: 19   Temp: 98.1 F (36.7 C)   SpO2: 94% 94%   Vitals:   05/05/19 1945 05/06/19 0554 05/06/19 0746 05/06/19 1305  BP: (!) 99/59 (!) 91/58 122/77   Pulse: 69 (!) 54 (!) 58 70  Resp: 16 14 19    Temp: 98.3 F (36.8 C) 97.8 F (36.6 C) 98.1 F (36.7 C)   TempSrc: Oral Oral Oral   SpO2: 95% 96% 94% 94%  Weight:  (!) 148.2 kg    Height:        General: Pt is alert, awake, not in acute distress Cardiovascular: RRR, nl S1-S2, no murmurs appreciated.   No LE edema.   Respiratory: Normal respiratory rate and rhythm.  CTAB without rales or wheezes. Abdominal: Abdomen soft and non-tender.  No distension or HSM.   Neuro/Psych: Strength symmetric in upper and lower extremities.  Judgment and insight appear normal.   The results of  significant diagnostics from this hospitalization (including imaging, microbiology, ancillary and laboratory) are listed below for reference.     Microbiology: Recent Results (from the past 240 hour(s))  SARS CORONAVIRUS 2 (TAT 6-24 HRS) Nasopharyngeal Nasopharyngeal Swab     Status: Abnormal   Collection Time: 05/03/19  4:41 AM   Specimen: Nasopharyngeal Swab  Result Value Ref Range Status   SARS Coronavirus 2 POSITIVE (A) NEGATIVE Final    Comment: RESULT CALLED TO, READ BACK BY AND VERIFIED WITH: S.JONES,RN 1624 [id number] I.MANNING (NOTE) SARS-CoV-2 target nucleic acids are DETECTED. The SARS-CoV-2 RNA is generally detectable in upper and lower respiratory specimens during the acute phase of infection. Positive results are indicative of active infection with SARS-CoV-2. Clinical  correlation with patient history and other diagnostic information is necessary to determine patient infection status. Positive results do  not rule out bacterial infection or co-infection with other viruses. The expected result is Negative. Fact Sheet for Patients: SugarRoll.be Fact Sheet for Healthcare Providers: https://www.woods-mathews.com/ This test is not yet approved or cleared by the [place] FDA and  has been authorized for detection and/or diagnosis of SARS-CoV-2 by FDA under an Emergency Use Authorization (EUA). This EUA will remain  in effect (meaning this test can be used) for the  duration of the COVID-19 declaration under Section 564(b)(1) of the Act, 21 U.S.C. section 360bbb-3(b)(1), unless the authorization is terminated or revoked sooner. Performed at [facility], [place] [address], [place], [place]  [id number]      Labs: BNP (last 3 results) Recent Labs    05/03/19 0751  BNP [id number]   Basic Metabolic Panel: Recent Labs  Lab 05/03/19 0214 05/03/19 0751 05/04/19 0413 05/05/19 0649 05/06/19 0606  NA 138 139 139 139 139  K 3.8  3.8 4.1 3.5 3.5  CL 107 105 105 104 106  CO2 20* 26 26 24 25   GLUCOSE 265* 188* 306* 196* 218*  BUN 11 9 10 18 18   CREATININE 0.72 0.59 0.71 0.64 0.59  CALCIUM 9.0 8.6* 8.7* 8.7* 8.5*  MG  --   --   --  2.1 2.3   Liver Function Tests: Recent Labs  Lab 05/03/19 0214 05/03/19 0751 05/04/19 0413 05/05/19 0649 05/06/19 0606  AST 37 29 27 24 16   ALT 36 35 37 30 21  ALKPHOS 68 71 66 55 52  BILITOT 0.6 0.6 0.7 0.8 0.7  PROT 6.7 7.0 7.1 6.5 6.0*  ALBUMIN 3.7 3.9 3.8 3.6 3.4*   Recent Labs  Lab 05/03/19 0214 05/03/19 0751  LIPASE 27 23   No results for input(s): AMMONIA in the last 168 hours. CBC: Recent Labs  Lab 05/03/19 0214 05/03/19 0751 05/04/19 0413 05/05/19 0649  WBC 4.1 3.6* 2.9* 7.4  NEUTROABS 1.8 1.7  --   --   HGB 14.3 14.3 14.6 14.1  HCT 43.8 44.5 45.4 43.3  MCV 88.1 89.0 89.2 87.7  PLT 152 156 149* 183   Cardiac Enzymes: No results for input(s): CKTOTAL, CKMB, CKMBINDEX, TROPONINI in the last 168 hours. BNP: Invalid input(s): POCBNP CBG: Recent Labs  Lab 05/05/19 1143 05/05/19 1738 05/05/19 1948 05/06/19 0748 05/06/19 1144  GLUCAP 232* 324* 324* 194* 279*   D-Dimer No results for input(s): DDIMER in the last 72 hours. Hgb A1c Recent Labs    05/04/19 0413  HGBA1C 9.1*   Lipid Profile No results for input(s): CHOL, HDL, LDLCALC, TRIG, CHOLHDL, LDLDIRECT in the last 72 hours. Thyroid function studies No results for input(s): TSH, T4TOTAL, T3FREE, THYROIDAB in the last 72 hours.  Invalid input(s): FREET3 Anemia work up Recent Labs    05/04/19 0101  FERRITIN 217   Urinalysis    Component Value Date/Time   COLORURINE YELLOW (A) 05/03/2019 0214   APPEARANCEUR CLEAR (A) 05/03/2019 0214   LABSPEC 1.017 05/03/2019 0214   PHURINE 5.0 05/03/2019 0214   GLUCOSEU >=500 (A) 05/03/2019 0214   HGBUR NEGATIVE 05/03/2019 0214   BILIRUBINUR NEGATIVE 05/03/2019 0214   KETONESUR NEGATIVE 05/03/2019 0214   PROTEINUR NEGATIVE 05/03/2019 0214    NITRITE NEGATIVE 05/03/2019 0214   LEUKOCYTESUR NEGATIVE 05/03/2019 0214   Sepsis Labs Invalid input(s): PROCALCITONIN,  WBC,  LACTICIDVEN Microbiology Recent Results (from the past 240 hour(s))  SARS CORONAVIRUS 2 (TAT 6-24 HRS) Nasopharyngeal Nasopharyngeal Swab     Status: Abnormal   Collection Time: 05/03/19  4:41 AM   Specimen: Nasopharyngeal Swab  Result Value Ref Range Status   SARS Coronavirus 2 POSITIVE (A) NEGATIVE Final    Comment: RESULT CALLED TO, READ BACK BY AND VERIFIED WITH: S.JONES,RN 1624 [id number] I.MANNING (NOTE) SARS-CoV-2 target nucleic acids are DETECTED. The SARS-CoV-2 RNA is generally detectable in upper and lower respiratory specimens during the acute phase of infection. Positive results are indicative of active infection with SARS-CoV-2. Clinical  correlation with patient history and other diagnostic information is necessary to determine patient infection status. Positive results do  not rule out bacterial infection or co-infection with other viruses. The expected result is Negative. Fact  Sheet for Patients: SugarRoll.be Fact Sheet for Healthcare Providers: https://www.woods-mathews.com/ This test is not yet approved or cleared by the [place] FDA and  has been authorized for detection and/or diagnosis of SARS-CoV-2 by FDA under an Emergency Use Authorization (EUA). This EUA will remain  in effect (meaning this test can be used) for the  duration of the COVID-19 declaration under Section 564(b)(1) of the Act, 21 U.S.C. section 360bbb-3(b)(1), unless the authorization is terminated or revoked sooner. Performed at [facility], [place] [address] [place], [place] [address]      Time coordinating discharge: 40 minutes       SIGNED:   Edwin Dada, MD  [facility] 05/06/2019, 8:38 PM

## 2019-05-06 NOTE — Plan of Care (Signed)
  Problem: Education: Goal: Knowledge of General Education information will improve Description: Including pain rating scale, medication(s)/side effects and non-pharmacologic comfort measures Outcome: Progressing   Problem: Clinical Measurements: Goal: Will remain free from infection Outcome: Progressing Goal: Respiratory complications will improve Outcome: Progressing   Problem: Nutrition: Goal: Adequate nutrition will be maintained Outcome: Progressing   

## 2019-07-05 ENCOUNTER — Ambulatory Visit: Payer: Self-pay | Admitting: General Surgery

## 2019-07-05 NOTE — H&P (View-Only) (Signed)
PATIENT PROFILE: Grace Flores is a 54 y.o. female who presents to the Clinic for consultation at the request of Dr. Doy Flores for evaluation of cholelithiasis.  PCP:  Grace Crouch, MD  HISTORY OF PRESENT ILLNESS: Grace Flores reports having pain on her right upper quadrant since 2 months ago.  She reports that it has been getting more constant.  The pain radiates to her back.  Pain is associated with meals.  There is no alleviating factor.  Patient has tried Tylenol for pain control but does not improve the pain.  Patient denies fever or chills.  In one of the episode patient went to the ED and she was found with bilateral pneumonia and Covid-19 positive.  She was admitted and treated and responded adequately to therapy.  I personally evaluated the chest x-ray done 2 days ago and there is no significant bilateral infiltrates.  There is no significant cardiopulmonary disease identified on the x-ray.  I also evaluated the [facility] chart and reviewed the admission on November.  CT scan and ultrasound shows cholelithiasis.  These images were nondiagnostic of cholecystitis due to body habitus.  Patient responded to antibiotic therapy.   PROBLEM LIST:         Problem List  Date Reviewed: 07/02/2019         Noted   Type II diabetes mellitus with complication (CMS-HCC) 41/02/6221   Chest pain with high risk for cardiac etiology 07/26/2018   HTN, goal below 140/80 10/27/2017   Kidney stone 09/08/2017   Obesity, unspecified 08/17/2016   Hyperglycemia 07/02/2014   Hyperlipidemia 12/14/2013   Edema 12/14/2013   Psoriasis 12/14/2013      GENERAL REVIEW OF SYSTEMS:   General ROS: negative for - chills, fatigue, fever, positive for weight gain Allergy and Immunology ROS: negative for - hives  Hematological and Lymphatic ROS: negative for - bleeding problems or bruising, negative for palpable nodes Endocrine ROS: negative for - heat or cold intolerance, hair changes Respiratory ROS:  negative for - cough, shortness of breath or wheezing Cardiovascular ROS: no chest pain or palpitations GI ROS: negative for nausea, vomiting, constipation.  Positive for abdominal pain and diarrhea Musculoskeletal ROS: negative for - joint swelling or muscle pain Neurological ROS: negative for - confusion, syncope Dermatological ROS: negative for pruritus and rash Psychiatric: negative for anxiety, depression, difficulty sleeping and memory loss  MEDICATIONS: Current Medications        Current Outpatient Medications  Medication Sig Dispense Refill  . atorvastatin (LIPITOR) 10 MG tablet Take 1 tablet (10 mg total) by mouth once daily 90 tablet 1  . glimepiride (AMARYL) 4 MG tablet TAKE 1 TABLET (4 MG TOTAL) BY MOUTH 2 (TWO) TIMES DAILY 60 tablet 1  . meloxicam (MOBIC) 7.5 MG tablet Take 1 tablet (7.5 mg total) by mouth once daily (Patient not taking: Reported on 07/02/2019  ) 30 tablet 11  . pioglitazone (ACTOS) 30 MG tablet TAKE 1 TABLET BY MOUTH EVERY DAY 30 tablet 1   No current facility-administered medications for this visit.       ALLERGIES: Adhesive tape-silicones and Percocet [oxycodone-acetaminophen]  PAST MEDICAL HISTORY:     Past Medical History:  Diagnosis Date  . Cervicalgia   . Eczema, unspecified   . History of endometriosis    stage IV, S/P total hysterectomy  . History of kidney stones   . History of onychomycosis    toenails  . Hyperlipidemia   . Melanoma (CMS-HCC)    Superficial, left shoulder s/p right  excision  . 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 Metatarsalgia of left foot    likely Morton's neuroma  . Migraine   . Obesity   . Psoriasis   . Psoriasis   . Seasonal allergies     PAST SURGICAL HISTORY:      Past Surgical History:  Procedure Laterality Date  . Laparoscopic resection of endometriosis     Laparoscopic enterolysis and excision of bilateral endometriomata  . Nerve injection to foot      diagnostic and therapeutic nerve injection to left forefoot.  . Removal of melanoma  03/2011   left shoulder  . TAH and BSO     for stage IV endometriosis  . Wisdom teeth removed       FAMILY HISTORY:      Family History  Problem Relation Age of Onset  . Asthma Mother   . Diabetes type II Mother   . Myocardial Infarction (Heart attack) Mother   . High blood pressure (Hypertension) Mother   . Diabetes type II Father   . Breast cancer Maternal Aunt   . Colon cancer Other   . Stroke Other      SOCIAL HISTORY: Social History          Socioeconomic History  . Marital status: Married    Spouse name: Not on file  . Number of children: Not on file  . Years of education: Not on file  . Highest education level: Not on file  Occupational History  . Not on file  Social Needs  . Financial resource strain: Not on file  . Food insecurity    Worry: Not on file    Inability: Not on file  . Transportation needs    Medical: Not on file    Non-medical: Not on file  Tobacco Use  . Smoking status: Never Smoker  . Smokeless tobacco: Never Used  Substance and Sexual Activity  . Alcohol use: No  . Drug use: Not on file  . Sexual activity: Not on file  Other Topics Concern  . Not on file  Social History Narrative  . Not on file      PHYSICAL EXAM:    Vitals:   07/05/19 1538  BP: 129/69  Pulse: 83   Body mass index is 54.39 kg/m. Weight: (!) 152.9 kg (337 lb)   GENERAL: Alert, active, oriented x3  HEENT: Pupils equal reactive to light. Extraocular movements are intact. Sclera clear. Palpebral conjunctiva normal red color.Pharynx clear.  NECK: Supple with no palpable mass and no adenopathy.  LUNGS: Sound clear with no rales rhonchi or wheezes.  HEART: Regular rhythm S1 and S2 without murmur.  ABDOMEN: Soft and depressible, moderate tender to palpation, with no palpable mass, no hepatomegaly.   EXTREMITIES: Well-developed  well-nourished symmetrical with no dependent edema.  NEUROLOGICAL: Awake alert oriented, facial expression symmetrical, moving all extremities.  REVIEW OF DATA: I have reviewed the following data today:      Appointment on 06/25/2019  Component Date Value  . Glucose 06/25/2019 167*  . Sodium 06/25/2019 139   . Potassium 06/25/2019 4.2   . Chloride 06/25/2019 106   . Carbon Dioxide (CO2) 06/25/2019 29.0   . Urea Nitrogen (BUN) 06/25/2019 14   . Creatinine 06/25/2019 0.6   . Glomerular Filtration Ra* 06/25/2019 105   . Calcium 06/25/2019 9.4   . AST  06/25/2019 13   . ALT  06/25/2019 19   . Alk Phos (alkaline Phosp*  06/25/2019 64   . Albumin 06/25/2019 3.9   . Bilirubin, Total 06/25/2019 0.5   . Protein, Total 06/25/2019 5.5*  . A/G Ratio 06/25/2019 2.4   . Cholesterol, Total 06/25/2019 191   . Triglyceride 06/25/2019 176   . HDL (High Density Lipopr* 06/25/2019 57.7   . LDL Calculated 06/25/2019 98   . VLDL Cholesterol 06/25/2019 35   . Cholesterol/HDL Ratio 06/25/2019 3.3   . Hemoglobin A1C 06/25/2019 8.6*  . Average Blood Glucose (C* 06/25/2019 200      ASSESSMENT: Ms. Gerhart is a 54 y.o. female presenting for consultation for cholelithiasis.    Patient was oriented about the diagnosis of cholelithiasis. Also oriented about what is the gallbladder, its anatomy and function and the implications of having stones. The patient was oriented about the treatment alternatives (observation vs cholecystectomy). Patient was oriented that a low percentage of patient will continue to have similar pain symptoms even after the gallbladder is removed. Surgical technique (open vs robotic assisted laparoscopic) was discussed. It was also discussed the goals of the surgery (decrease the pain episodes and avoid the risk of cholecystitis) and the risk of surgery including: bleeding, infection, common bile duct injury, stone retention, injury to other organs such as bowel, liver, stomach,  other complications such as hernia, bowel obstruction among others. Also discussed with patient about anesthesia and its complications such as: reaction to medications, pneumonia, heart complications, death, among others.  I also discussed with patient about weight loss.  I recommended with a screening.  Patient refused to go to any of the weight loss clinic because she think that there is of these clinics are too high.  I discussed with the patient that the benefit of losing weight will help her with her blood pressure, diabetes and her comorbidities.  Patient did not want to be referred to the wellness clinic.  Patient is at higher risk of surgery due to her weight and comorbidities.  Cholelithiasis without cholecystitis [K80.20]  PLAN: 1. Robotic assisted laparoscopic cholecystectomy (31438) 2.  CBC, CMP 3.  Internal medicine clearance 4.  Do not take aspirin 5 days before the procedure 5.  Contact us if has any question or concern.  Patient verbalized understanding, all questions were answered, and were agreeable with the plan outlined above.   Herbert Pun, MD  Electronically signed by Herbert Pun, MD

## 2019-07-05 NOTE — H&P (Signed)
PATIENT PROFILE: Grace Flores is a 54 y.o. female who presents to the Clinic for consultation at the request of Dr. Doy Hutching for evaluation of cholelithiasis.  PCP:  Idelle Crouch, MD  HISTORY OF PRESENT ILLNESS: Grace Flores reports having pain on her right upper quadrant since 2 months ago.  She reports that it has been getting more constant.  The pain radiates to her back.  Pain is associated with meals.  There is no alleviating factor.  Patient has tried Tylenol for pain control but does not improve the pain.  Patient denies fever or chills.  In one of the episode patient went to the ED and she was found with bilateral pneumonia and Covid-19 positive.  She was admitted and treated and responded adequately to therapy.  I personally evaluated the chest x-ray done 2 days ago and there is no significant bilateral infiltrates.  There is no significant cardiopulmonary disease identified on the x-ray.  I also evaluated the [facility] chart and reviewed the admission on November.  CT scan and ultrasound shows cholelithiasis.  These images were nondiagnostic of cholecystitis due to body habitus.  Patient responded to antibiotic therapy.   PROBLEM LIST:         Problem List  Date Reviewed: 07/02/2019         Noted   Type II diabetes mellitus with complication (CMS-HCC) 59/12/4161   Chest pain with high risk for cardiac etiology 07/26/2018   HTN, goal below 140/80 10/27/2017   Kidney stone 09/08/2017   Obesity, unspecified 08/17/2016   Hyperglycemia 07/02/2014   Hyperlipidemia 12/14/2013   Edema 12/14/2013   Psoriasis 12/14/2013      GENERAL REVIEW OF SYSTEMS:   General ROS: negative for - chills, fatigue, fever, positive for weight gain Allergy and Immunology ROS: negative for - hives  Hematological and Lymphatic ROS: negative for - bleeding problems or bruising, negative for palpable nodes Endocrine ROS: negative for - heat or cold intolerance, hair changes Respiratory ROS:  negative for - cough, shortness of breath or wheezing Cardiovascular ROS: no chest pain or palpitations GI ROS: negative for nausea, vomiting, constipation.  Positive for abdominal pain and diarrhea Musculoskeletal ROS: negative for - joint swelling or muscle pain Neurological ROS: negative for - confusion, syncope Dermatological ROS: negative for pruritus and rash Psychiatric: negative for anxiety, depression, difficulty sleeping and memory loss  MEDICATIONS: Current Medications        Current Outpatient Medications  Medication Sig Dispense Refill  . atorvastatin (LIPITOR) 10 MG tablet Take 1 tablet (10 mg total) by mouth once daily 90 tablet 1  . glimepiride (AMARYL) 4 MG tablet TAKE 1 TABLET (4 MG TOTAL) BY MOUTH 2 (TWO) TIMES DAILY 60 tablet 1  . meloxicam (MOBIC) 7.5 MG tablet Take 1 tablet (7.5 mg total) by mouth once daily (Patient not taking: Reported on 07/02/2019  ) 30 tablet 11  . pioglitazone (ACTOS) 30 MG tablet TAKE 1 TABLET BY MOUTH EVERY DAY 30 tablet 1   No current facility-administered medications for this visit.       ALLERGIES: Adhesive tape-silicones and Percocet [oxycodone-acetaminophen]  PAST MEDICAL HISTORY:     Past Medical History:  Diagnosis Date  . Cervicalgia   . Eczema, unspecified   . History of endometriosis    stage IV, S/P total hysterectomy  . History of kidney stones   . History of onychomycosis    toenails  . Hyperlipidemia   . Melanoma (CMS-HCC)    Superficial, left shoulder s/p right  excision  . 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 Metatarsalgia of left foot    likely Morton's neuroma  . Migraine   . Obesity   . Psoriasis   . Psoriasis   . Seasonal allergies     PAST SURGICAL HISTORY:      Past Surgical History:  Procedure Laterality Date  . Laparoscopic resection of endometriosis     Laparoscopic enterolysis and excision of bilateral endometriomata  . Nerve injection to foot      diagnostic and therapeutic nerve injection to left forefoot.  . Removal of melanoma  03/2011   left shoulder  . TAH and BSO     for stage IV endometriosis  . Wisdom teeth removed       FAMILY HISTORY:      Family History  Problem Relation Age of Onset  . Asthma Mother   . Diabetes type II Mother   . Myocardial Infarction (Heart attack) Mother   . High blood pressure (Hypertension) Mother   . Diabetes type II Father   . Breast cancer Maternal Aunt   . Colon cancer Other   . Stroke Other      SOCIAL HISTORY: Social History          Socioeconomic History  . Marital status: Married    Spouse name: Not on file  . Number of children: Not on file  . Years of education: Not on file  . Highest education level: Not on file  Occupational History  . Not on file  Social Needs  . Financial resource strain: Not on file  . Food insecurity    Worry: Not on file    Inability: Not on file  . Transportation needs    Medical: Not on file    Non-medical: Not on file  Tobacco Use  . Smoking status: Never Smoker  . Smokeless tobacco: Never Used  Substance and Sexual Activity  . Alcohol use: No  . Drug use: Not on file  . Sexual activity: Not on file  Other Topics Concern  . Not on file  Social History Narrative  . Not on file      PHYSICAL EXAM:    Vitals:   07/05/19 1538  BP: 129/69  Pulse: 83   Body mass index is 54.39 kg/m. Weight: (!) 152.9 kg (337 lb)   GENERAL: Alert, active, oriented x3  HEENT: Pupils equal reactive to light. Extraocular movements are intact. Sclera clear. Palpebral conjunctiva normal red color.Pharynx clear.  NECK: Supple with no palpable mass and no adenopathy.  LUNGS: Sound clear with no rales rhonchi or wheezes.  HEART: Regular rhythm S1 and S2 without murmur.  ABDOMEN: Soft and depressible, moderate tender to palpation, with no palpable mass, no hepatomegaly.   EXTREMITIES: Well-developed  well-nourished symmetrical with no dependent edema.  NEUROLOGICAL: Awake alert oriented, facial expression symmetrical, moving all extremities.  REVIEW OF DATA: I have reviewed the following data today:      Appointment on 06/25/2019  Component Date Value  . Glucose 06/25/2019 167*  . Sodium 06/25/2019 139   . Potassium 06/25/2019 4.2   . Chloride 06/25/2019 106   . Carbon Dioxide (CO2) 06/25/2019 29.0   . Urea Nitrogen (BUN) 06/25/2019 14   . Creatinine 06/25/2019 0.6   . Glomerular Filtration Ra* 06/25/2019 105   . Calcium 06/25/2019 9.4   . AST  06/25/2019 13   . ALT  06/25/2019 19   . Alk Phos (alkaline Phosp*  06/25/2019 64   . Albumin 06/25/2019 3.9   . Bilirubin, Total 06/25/2019 0.5   . Protein, Total 06/25/2019 5.5*  . A/G Ratio 06/25/2019 2.4   . Cholesterol, Total 06/25/2019 191   . Triglyceride 06/25/2019 176   . HDL (High Density Lipopr* 06/25/2019 57.7   . LDL Calculated 06/25/2019 98   . VLDL Cholesterol 06/25/2019 35   . Cholesterol/HDL Ratio 06/25/2019 3.3   . Hemoglobin A1C 06/25/2019 8.6*  . Average Blood Glucose (C* 06/25/2019 200      ASSESSMENT: Ms. Blackwelder is a 54 y.o. female presenting for consultation for cholelithiasis.    Patient was oriented about the diagnosis of cholelithiasis. Also oriented about what is the gallbladder, its anatomy and function and the implications of having stones. The patient was oriented about the treatment alternatives (observation vs cholecystectomy). Patient was oriented that a low percentage of patient will continue to have similar pain symptoms even after the gallbladder is removed. Surgical technique (open vs robotic assisted laparoscopic) was discussed. It was also discussed the goals of the surgery (decrease the pain episodes and avoid the risk of cholecystitis) and the risk of surgery including: bleeding, infection, common bile duct injury, stone retention, injury to other organs such as bowel, liver, stomach,  other complications such as hernia, bowel obstruction among others. Also discussed with patient about anesthesia and its complications such as: reaction to medications, pneumonia, heart complications, death, among others.  I also discussed with patient about weight loss.  I recommended with a screening.  Patient refused to go to any of the weight loss clinic because she think that there is of these clinics are too high.  I discussed with the patient that the benefit of losing weight will help her with her blood pressure, diabetes and her comorbidities.  Patient did not want to be referred to the wellness clinic.  Patient is at higher risk of surgery due to her weight and comorbidities.  Cholelithiasis without cholecystitis [K80.20]  PLAN: 1. Robotic assisted laparoscopic cholecystectomy (44010) 2.  CBC, CMP 3.  Internal medicine clearance 4.  Do not take aspirin 5 days before the procedure 5.  Contact us if has any question or concern.  Patient verbalized understanding, all questions were answered, and were agreeable with the plan outlined above.   Herbert Pun, MD  Electronically signed by Herbert Pun, MD

## 2019-07-20 ENCOUNTER — Other Ambulatory Visit: Payer: Self-pay

## 2019-07-20 ENCOUNTER — Encounter
Admission: RE | Admit: 2019-07-20 | Discharge: 2019-07-20 | Disposition: A | Discharge status: Home / Self Care | Payer: Commercial Managed Care - PPO | Source: Ambulatory Visit | Attending: General Surgery | Admitting: General Surgery

## 2019-07-20 HISTORY — DX: Personal history of urinary calculi: Z87.442

## 2019-07-20 NOTE — Patient Instructions (Signed)
Your procedure is scheduled on: Friday 07/27/19.  Report to DAY SURGERY DEPARTMENT LOCATED ON 2ND FLOOR MEDICAL MALL ENTRANCE. To find out your arrival time please call [phone] between 1PM - 3PM on Thursday 07/26/19.   Remember: Instructions that are not followed completely may result in serious medical risk, up to and including death, or upon the discretion of your surgeon and anesthesiologist your surgery may need to be rescheduled.      _X__ 1. Do not eat food after midnight the night before your procedure.                 No gum chewing or hard candies. You may drink SUGAR FREE clear liquids up to 2 hours                 before you are scheduled to arrive for your surgery- DO NOT drink clear                 liquids within 2 hours of the start of your surgery.                    __X__2.  On the morning of surgery brush your teeth with toothpaste and water, you may rinse your mouth with mouthwash if you wish.  Do not swallow any toothpaste or mouthwash.       _X__ 3.  No Alcohol for 24 hours before or after surgery.     _X__ 4.  Do Not Smoke or use e-cigarettes For 24 Hours Prior to Your Surgery.                 Do not use any chewable tobacco products for at least 6 hours prior to                 surgery.   __X__5.  Notify your doctor if there is any change in your medical condition      (cold, fever, infections).       Do not wear jewelry, make-up, hairpins, clips or nail polish. Do not wear lotions, powders, or perfumes.  Do not shave 48 hours prior to surgery. Men may shave face and neck. Do not bring valuables to the hospital.    [facility] is not responsible for any belongings or valuables.   Contacts, dentures/partials or body piercings may not be worn into surgery. Bring a case for your contacts, glasses or hearing aids, a denture cup will be supplied.    Patients discharged the day of surgery will not be allowed to drive home.   __X__ Take these  medicines the morning of surgery with A SIP OF WATER:     1. atorvastatin (LIPITOR) 10 MG tablet  2. acetaminophen (TYLENOL) 325 MG tablet if needed  3.   4.  5.  6.    __X__ Use Soap as directed    __X__ Stop Anti-inflammatories 7 days before surgery such as Advil, Ibuprofen, Motrin, BC or Goodies Powder, Naprosyn, Naproxen, Aleve, Aspirin, Meloxicam. May take Tylenol if needed for pain or discomfort.

## 2019-07-23 ENCOUNTER — Other Ambulatory Visit: Payer: Commercial Managed Care - PPO

## 2019-07-25 ENCOUNTER — Other Ambulatory Visit: Admission: RE | Admit: 2019-07-25 | Payer: Commercial Managed Care - PPO | Source: Ambulatory Visit

## 2019-07-25 ENCOUNTER — Ambulatory Visit: Admit: 2019-07-25 | Payer: Commercial Managed Care - PPO | Admitting: General Surgery

## 2019-07-26 MED ORDER — CEFAZOLIN SODIUM-DEXTROSE 2-4 GM/100ML-% IV SOLN
2.0000 g | INTRAVENOUS | Status: AC
  Administered 2019-07-27: 2 g via INTRAVENOUS

## 2019-07-26 MED ORDER — INDOCYANINE GREEN 25 MG IV SOLR
7.5000 mg | Freq: Once | INTRAVENOUS | Status: AC
  Administered 2019-07-27: 7.5 mg via INTRAVENOUS
  Filled 2019-07-26: qty 25

## 2019-07-27 ENCOUNTER — Ambulatory Visit
Admission: RE | Admit: 2019-07-27 | Discharge: 2019-07-27 | Disposition: A | Discharge status: Home / Self Care | Payer: Commercial Managed Care - PPO | Source: Ambulatory Visit | Attending: General Surgery | Admitting: General Surgery

## 2019-07-27 ENCOUNTER — Ambulatory Visit: Payer: Commercial Managed Care - PPO

## 2019-07-27 ENCOUNTER — Encounter
Admission: RE | Disposition: A | Discharge status: Home / Self Care | Payer: Self-pay | Source: Ambulatory Visit | Attending: General Surgery

## 2019-07-27 ENCOUNTER — Other Ambulatory Visit: Payer: Self-pay

## 2019-07-27 ENCOUNTER — Encounter: Payer: Self-pay | Admitting: General Surgery

## 2019-07-27 DIAGNOSIS — E119 Type 2 diabetes mellitus without complications: Secondary | ICD-10-CM | POA: Insufficient documentation

## 2019-07-27 DIAGNOSIS — Z79899 Other long term (current) drug therapy: Secondary | ICD-10-CM | POA: Insufficient documentation

## 2019-07-27 DIAGNOSIS — E785 Hyperlipidemia, unspecified: Secondary | ICD-10-CM | POA: Insufficient documentation

## 2019-07-27 DIAGNOSIS — K8010 Calculus of gallbladder with chronic cholecystitis without obstruction: Secondary | ICD-10-CM | POA: Diagnosis not present

## 2019-07-27 DIAGNOSIS — Z8616 Personal history of COVID-19: Secondary | ICD-10-CM | POA: Insufficient documentation

## 2019-07-27 DIAGNOSIS — I10 Essential (primary) hypertension: Secondary | ICD-10-CM | POA: Insufficient documentation

## 2019-07-27 DIAGNOSIS — Z6841 Body Mass Index (BMI) 40.0 and over, adult: Secondary | ICD-10-CM | POA: Diagnosis not present

## 2019-07-27 DIAGNOSIS — Z7984 Long term (current) use of oral hypoglycemic drugs: Secondary | ICD-10-CM | POA: Insufficient documentation

## 2019-07-27 LAB — GLUCOSE, CAPILLARY
Glucose-Capillary: 156 mg/dL — ABNORMAL HIGH (ref 70–99)
Glucose-Capillary: 223 mg/dL — ABNORMAL HIGH (ref 70–99)

## 2019-07-27 SURGERY — CHOLECYSTECTOMY, ROBOT-ASSISTED, LAPAROSCOPIC
Anesthesia: General | Site: Abdomen

## 2019-07-27 MED ORDER — HYDROCODONE-ACETAMINOPHEN 5-325 MG PO TABS
1.0000 | ORAL_TABLET | Freq: Once | ORAL | Status: AC
  Administered 2019-07-27: 1 via ORAL

## 2019-07-27 MED ORDER — METOCLOPRAMIDE HCL 5 MG/ML IJ SOLN
10.0000 mg | Freq: Once | INTRAMUSCULAR | Status: AC
  Administered 2019-07-27: 10 mg via INTRAVENOUS

## 2019-07-27 MED ORDER — CEFAZOLIN SODIUM-DEXTROSE 2-4 GM/100ML-% IV SOLN
INTRAVENOUS | Status: AC
  Filled 2019-07-27: qty 100

## 2019-07-27 MED ORDER — ONDANSETRON HCL 4 MG/2ML IJ SOLN
INTRAMUSCULAR | Status: DC | PRN
  Administered 2019-07-27: 4 mg via INTRAVENOUS

## 2019-07-27 MED ORDER — MIDAZOLAM HCL 2 MG/2ML IJ SOLN
INTRAMUSCULAR | Status: AC
  Filled 2019-07-27: qty 2

## 2019-07-27 MED ORDER — DEXAMETHASONE SODIUM PHOSPHATE 10 MG/ML IJ SOLN: INTRAMUSCULAR | Status: DC | PRN

## 2019-07-27 MED ORDER — SUCCINYLCHOLINE CHLORIDE 20 MG/ML IJ SOLN
INTRAMUSCULAR | Status: DC | PRN
  Administered 2019-07-27: 100 mg via INTRAVENOUS

## 2019-07-27 MED ORDER — HYDROCODONE-ACETAMINOPHEN 5-325 MG PO TABS
ORAL_TABLET | ORAL | Status: AC
  Administered 2019-07-27: 1 via ORAL
  Filled 2019-07-27: qty 1

## 2019-07-27 MED ORDER — PHENYLEPHRINE HCL (PRESSORS) 10 MG/ML IV SOLN
INTRAVENOUS | Status: DC | PRN
  Administered 2019-07-27 (×3): 100 ug via INTRAVENOUS

## 2019-07-27 MED ORDER — HYDROCODONE-ACETAMINOPHEN 5-325 MG PO TABS: 1.0000 | ORAL_TABLET | Freq: Once | ORAL | Status: AC

## 2019-07-27 MED ORDER — HYDROMORPHONE HCL 1 MG/ML IJ SOLN
0.2500 mg | INTRAMUSCULAR | Status: DC | PRN
  Administered 2019-07-27: 0.5 mg via INTRAVENOUS

## 2019-07-27 MED ORDER — METOCLOPRAMIDE HCL 5 MG/ML IJ SOLN
INTRAMUSCULAR | Status: AC
  Filled 2019-07-27: qty 2

## 2019-07-27 MED ORDER — HYDROMORPHONE HCL 1 MG/ML IJ SOLN
INTRAMUSCULAR | Status: AC
  Administered 2019-07-27: 0.5 mg via INTRAVENOUS
  Filled 2019-07-27: qty 1

## 2019-07-27 MED ORDER — MIDAZOLAM HCL 2 MG/2ML IJ SOLN
INTRAMUSCULAR | Status: DC | PRN
  Administered 2019-07-27: 4 mg via INTRAVENOUS

## 2019-07-27 MED ORDER — DEXMEDETOMIDINE HCL 200 MCG/2ML IV SOLN
INTRAVENOUS | Status: DC | PRN
  Administered 2019-07-27: 4 ug via INTRAVENOUS
  Administered 2019-07-27: 8 ug via INTRAVENOUS
  Administered 2019-07-27: 4 ug via INTRAVENOUS

## 2019-07-27 MED ORDER — FENTANYL CITRATE (PF) 100 MCG/2ML IJ SOLN
INTRAMUSCULAR | Status: AC
  Filled 2019-07-27: qty 2

## 2019-07-27 MED ORDER — ONDANSETRON HCL 4 MG/2ML IJ SOLN
INTRAMUSCULAR | Status: AC
  Filled 2019-07-27: qty 2

## 2019-07-27 MED ORDER — GLYCOPYRROLATE 0.2 MG/ML IJ SOLN
INTRAMUSCULAR | Status: DC | PRN
  Administered 2019-07-27: .2 mg via INTRAVENOUS

## 2019-07-27 MED ORDER — PROPOFOL 10 MG/ML IV BOLUS
INTRAVENOUS | Status: DC | PRN
  Administered 2019-07-27: 200 mg via INTRAVENOUS

## 2019-07-27 MED ORDER — PROPOFOL 10 MG/ML IV BOLUS
INTRAVENOUS | Status: AC
  Filled 2019-07-27: qty 40

## 2019-07-27 MED ORDER — ONDANSETRON HCL 4 MG/2ML IJ SOLN
4.0000 mg | Freq: Once | INTRAMUSCULAR | Status: AC | PRN
  Administered 2019-07-27: 4 mg via INTRAVENOUS

## 2019-07-27 MED ORDER — FENTANYL CITRATE (PF) 100 MCG/2ML IJ SOLN
25.0000 ug | INTRAMUSCULAR | Status: DC | PRN
  Administered 2019-07-27 (×4): 25 ug via INTRAVENOUS

## 2019-07-27 MED ORDER — SUGAMMADEX SODIUM 500 MG/5ML IV SOLN
INTRAVENOUS | Status: DC | PRN
  Administered 2019-07-27: 310 mg via INTRAVENOUS

## 2019-07-27 MED ORDER — FENTANYL CITRATE (PF) 100 MCG/2ML IJ SOLN
INTRAMUSCULAR | Status: DC | PRN
  Administered 2019-07-27: 50 ug via INTRAVENOUS
  Administered 2019-07-27 (×3): 25 ug via INTRAVENOUS
  Administered 2019-07-27: 50 ug via INTRAVENOUS
  Administered 2019-07-27: 25 ug via INTRAVENOUS

## 2019-07-27 MED ORDER — ROCURONIUM BROMIDE 100 MG/10ML IV SOLN
INTRAVENOUS | Status: DC | PRN
  Administered 2019-07-27: 50 mg via INTRAVENOUS
  Administered 2019-07-27: 45 mg via INTRAVENOUS
  Administered 2019-07-27: 5 mg via INTRAVENOUS

## 2019-07-27 MED ORDER — EPINEPHRINE PF 1 MG/ML IJ SOLN
INTRAMUSCULAR | Status: AC
  Filled 2019-07-27: qty 1

## 2019-07-27 MED ORDER — ROCURONIUM BROMIDE 50 MG/5ML IV SOLN
INTRAVENOUS | Status: AC
  Filled 2019-07-27: qty 1

## 2019-07-27 MED ORDER — FAMOTIDINE 20 MG PO TABS
ORAL_TABLET | ORAL | Status: AC
  Filled 2019-07-27: qty 1

## 2019-07-27 MED ORDER — LIDOCAINE HCL (CARDIAC) PF 100 MG/5ML IV SOSY
PREFILLED_SYRINGE | INTRAVENOUS | Status: DC | PRN
  Administered 2019-07-27: 100 mg via INTRAVENOUS

## 2019-07-27 MED ORDER — BUPIVACAINE HCL (PF) 0.25 % IJ SOLN
INTRAMUSCULAR | Status: AC
  Filled 2019-07-27: qty 30

## 2019-07-27 MED ORDER — SUCCINYLCHOLINE CHLORIDE 20 MG/ML IJ SOLN
INTRAMUSCULAR | Status: AC
  Filled 2019-07-27: qty 1

## 2019-07-27 MED ORDER — FENTANYL CITRATE (PF) 100 MCG/2ML IJ SOLN
INTRAMUSCULAR | Status: AC
  Administered 2019-07-27: 25 ug via INTRAVENOUS
  Filled 2019-07-27: qty 2

## 2019-07-27 MED ORDER — HYDROCODONE-ACETAMINOPHEN 5-325 MG PO TABS
ORAL_TABLET | ORAL | Status: AC
  Filled 2019-07-27: qty 1

## 2019-07-27 MED ORDER — LACTATED RINGERS IV SOLN: INTRAVENOUS | Status: DC | PRN

## 2019-07-27 MED ORDER — DEXAMETHASONE SODIUM PHOSPHATE 10 MG/ML IJ SOLN
INTRAMUSCULAR | Status: AC
  Filled 2019-07-27: qty 1

## 2019-07-27 MED ORDER — BUPIVACAINE-EPINEPHRINE 0.25% -1:200000 IJ SOLN
INTRAMUSCULAR | Status: DC | PRN
  Administered 2019-07-27: 30 mL

## 2019-07-27 MED ORDER — DEXAMETHASONE SODIUM PHOSPHATE 10 MG/ML IJ SOLN
INTRAMUSCULAR | Status: DC | PRN
  Administered 2019-07-27: 5 mg via INTRAVENOUS

## 2019-07-27 MED ORDER — DEXMEDETOMIDINE HCL IN NACL 400 MCG/100ML IV SOLN: INTRAVENOUS | Status: DC | PRN

## 2019-07-27 MED ORDER — SODIUM CHLORIDE 0.9 % IV SOLN: INTRAVENOUS | Status: DC

## 2019-07-27 MED ORDER — FAMOTIDINE 20 MG PO TABS
20.0000 mg | ORAL_TABLET | Freq: Once | ORAL | Status: AC
  Administered 2019-07-27: 07:00:00 20 mg via ORAL

## 2019-07-27 MED ORDER — HYDROCODONE-ACETAMINOPHEN 5-325 MG PO TABS: 1.0000 | ORAL_TABLET | ORAL | 0 refills | Status: AC | PRN

## 2019-07-27 SURGICAL SUPPLY — 59 items
BLADE SURG SZ11 CARB STEEL (BLADE) ×2 IMPLANT
CANISTER SUCT 1200ML W/VALVE (MISCELLANEOUS) ×2 IMPLANT
CANNULA REDUC XI 12-8 STAPL (CANNULA) ×1
CANNULA REDUCER 12-8 DVNC XI (CANNULA) ×1 IMPLANT
CHLORAPREP W/TINT 26 (MISCELLANEOUS) ×4 IMPLANT
CLIP VESOLOCK MED LG 6/CT (CLIP) ×2 IMPLANT
COVER TIP SHEARS 8 DVNC (MISCELLANEOUS) ×1 IMPLANT
COVER TIP SHEARS 8MM DA VINCI (MISCELLANEOUS) ×1
COVER WAND RF STERILE (DRAPES) ×2 IMPLANT
DECANTER SPIKE VIAL GLASS SM (MISCELLANEOUS) ×4 IMPLANT
DEFOGGER SCOPE WARMER CLEARIFY (MISCELLANEOUS) ×2 IMPLANT
DERMABOND ADVANCED (GAUZE/BANDAGES/DRESSINGS) ×1
DERMABOND ADVANCED .7 DNX12 (GAUZE/BANDAGES/DRESSINGS) ×1 IMPLANT
DRAPE 3/4 80X56 (DRAPES) IMPLANT
DRAPE ARM DVNC X/XI (DISPOSABLE) ×4 IMPLANT
DRAPE COLUMN DVNC XI (DISPOSABLE) ×1 IMPLANT
DRAPE DA VINCI XI ARM (DISPOSABLE) ×4
DRAPE DA VINCI XI COLUMN (DISPOSABLE) ×1
ELECT CAUTERY BLADE 6.4 (BLADE) ×2 IMPLANT
ELECT REM PT RETURN 9FT ADLT (ELECTROSURGICAL) ×2
ELECTRODE REM PT RTRN 9FT ADLT (ELECTROSURGICAL) ×1 IMPLANT
GLOVE BIO SURGEON STRL SZ 6.5 (GLOVE) ×8 IMPLANT
GLOVE BIOGEL PI IND STRL 6.5 (GLOVE) ×4 IMPLANT
GLOVE BIOGEL PI INDICATOR 6.5 (GLOVE) ×4
GOWN STRL REUS W/ TWL LRG LVL3 (GOWN DISPOSABLE) ×5 IMPLANT
GOWN STRL REUS W/TWL LRG LVL3 (GOWN DISPOSABLE) ×5
GRASPER SUT TROCAR 14GX15 (MISCELLANEOUS) ×2 IMPLANT
IRRIGATOR SUCT 8 DISP DVNC XI (IRRIGATION / IRRIGATOR) IMPLANT
IRRIGATOR SUCTION 8MM XI DISP (IRRIGATION / IRRIGATOR)
IV NS 1000ML (IV SOLUTION)
IV NS 1000ML BAXH (IV SOLUTION) IMPLANT
KIT IMAGING PINPOINTPAQ (MISCELLANEOUS) ×2 IMPLANT
KIT PINK PAD W/HEAD ARE REST (MISCELLANEOUS) ×2
KIT PINK PAD W/HEAD ARM REST (MISCELLANEOUS) ×1 IMPLANT
LABEL OR SOLS (LABEL) ×2 IMPLANT
NEEDLE HYPO 22GX1.5 SAFETY (NEEDLE) ×2 IMPLANT
NEEDLE INSUFFLATION 14GA 120MM (NEEDLE) ×2 IMPLANT
NS IRRIG 500ML POUR BTL (IV SOLUTION) ×2 IMPLANT
OBTURATOR OPTICAL STANDARD 8MM (TROCAR) ×1
OBTURATOR OPTICAL STND 8 DVNC (TROCAR) ×1
OBTURATOR OPTICALSTD 8 DVNC (TROCAR) ×1 IMPLANT
PACK LAP CHOLECYSTECTOMY (MISCELLANEOUS) ×2 IMPLANT
PENCIL ELECTRO HAND CTR (MISCELLANEOUS) ×2 IMPLANT
POUCH SPECIMEN RETRIEVAL 10MM (ENDOMECHANICALS) ×2 IMPLANT
SEAL CANN UNIV 5-8 DVNC XI (MISCELLANEOUS) ×3 IMPLANT
SEAL XI 5MM-8MM UNIVERSAL (MISCELLANEOUS) ×3
SOLUTION ELECTROLUBE (MISCELLANEOUS) ×2 IMPLANT
STAPLER CANNULA SEAL DVNC XI (STAPLE) ×1 IMPLANT
STAPLER CANNULA SEAL XI (STAPLE) ×1
SUT MNCRL 4-0 (SUTURE) ×2
SUT MNCRL 4-0 27XMFL (SUTURE) ×2
SUT VIC AB 3-0 SH 27 (SUTURE) ×1
SUT VIC AB 3-0 SH 27X BRD (SUTURE) ×1 IMPLANT
SUT VICRYL 0 AB UR-6 (SUTURE) ×2 IMPLANT
SUTURE MNCRL 4-0 27XMF (SUTURE) ×2 IMPLANT
SYSTEM WECK SHIELD CLOSURE (TROCAR) ×2 IMPLANT
TROCAR 5M 150ML BLDLS (TROCAR) ×2 IMPLANT
TROCAR XCEL NON-BLD 5MMX100MML (ENDOMECHANICALS) ×2 IMPLANT
TUBING EVAC SMOKE HEATED PNEUM (TUBING) ×2 IMPLANT

## 2019-07-27 NOTE — Discharge Instructions (Addendum)
  Diet: Resume home heart healthy regular diet.   Activity: No heavy lifting >20 pounds (children, pets, laundry, garbage) or strenuous activity until follow-up, but light activity and walking are encouraged. Do not drive or drink alcohol if taking narcotic pain medications.  Wound care: May shower with soapy water and pat dry (do not rub incisions), but no baths or submerging incision underwater until follow-up. (no swimming)   Medications: Resume all home medications. For mild to moderate pain: acetaminophen (Tylenol) or ibuprofen (if no kidney disease). Combining Tylenol with alcohol can substantially increase your risk of causing liver disease. Narcotic pain medications, if prescribed, can be used for severe pain, though may cause nausea, constipation, and drowsiness. Do not combine Tylenol and Norco within a 6 hour period as Norco contains Tylenol. If you do not need the narcotic pain medication, you do not need to fill the prescription.  Call office ([phone]) at any time if any questions, worsening pain, fevers/chills, bleeding, drainage from incision site, or other concerns.   AMBULATORY SURGERY  DISCHARGE INSTRUCTIONS   1) The drugs that you were given will stay in your system until tomorrow so for the next 24 hours you should not:  A) Drive an automobile B) Make any legal decisions C) Drink any alcoholic beverage   2) You may resume regular meals tomorrow.  Today it is better to start with liquids and gradually work up to solid foods.  You may eat anything you prefer, but it is better to start with liquids, then soup and crackers, and gradually work up to solid foods.   3) Please notify your doctor immediately if you have any unusual bleeding, trouble breathing, redness and pain at the surgery site, drainage, fever, or pain not relieved by medication.    4) Additional Instructions:        Please contact your physician with any problems or Same Day Surgery at  [phone], Monday through Friday 6 am to 4 pm, or [place] [facility] [place] Main number at [phone]. 

## 2019-07-27 NOTE — Anesthesia Procedure Notes (Addendum)
Procedure Name: Intubation Date/Time: 07/27/2019 7:40 AM Performed by: Hedda Slade, CRNA Pre-anesthesia Checklist: Patient identified, Patient being monitored, Timeout performed, Emergency Drugs available and Suction available Patient Re-evaluated:Patient Re-evaluated prior to induction Oxygen Delivery Method: Circle system utilized Preoxygenation: Pre-oxygenation with 100% oxygen Induction Type: IV induction Ventilation: Mask ventilation without difficulty and Oral airway inserted - appropriate to patient size Laryngoscope Size: 3 and McGraph Grade View: Grade I Tube type: Oral Tube size: 7.0 mm Number of attempts: 1 Airway Equipment and Method: Stylet and Video-laryngoscopy Placement Confirmation: ETT inserted through vocal cords under direct vision,  positive ETCO2 and breath sounds checked- equal and bilateral Secured at: 21 cm Tube secured with: Tape Dental Injury: Teeth and Oropharynx as per pre-operative assessment  Comments: DVL and intubation performed by Simeon Craft under supervision of Hedda Slade CRNA

## 2019-07-27 NOTE — Progress Notes (Signed)
Continues to have nausea  No vomiting     reglan given

## 2019-07-27 NOTE — Transfer of Care (Signed)
Immediate Anesthesia Transfer of Care Note  Patient: Grace Flores  Procedure(s) Performed: XI ROBOTIC ASSISTED LAPAROSCOPIC CHOLECYSTECTOMY (N/A Abdomen)  Patient Location: PACU  Anesthesia Type:General  Level of Consciousness: awake and oriented  Airway & Oxygen Therapy: Patient Spontanous Breathing and Patient connected to face mask oxygen  Post-op Assessment: Report given to RN and Post -op Vital signs reviewed and stable  Post vital signs: Reviewed and stable  Last Vitals:  Vitals Value Taken Time  BP 130/82 07/27/19 0956  Temp    Pulse 103 07/27/19 0958  Resp 22 07/27/19 0958  SpO2 98 % 07/27/19 0958  Vitals shown include unvalidated device data.  Last Pain:  Vitals:   07/27/19 0619  TempSrc: Oral  PainSc: 2          Complications: No apparent anesthesia complications

## 2019-07-27 NOTE — Progress Notes (Signed)
zofran given for nausea  

## 2019-07-27 NOTE — Interval H&P Note (Signed)
History and Physical Interval Note:  07/27/2019 6:56 AM  Grace Flores  has presented today for surgery, with the diagnosis of k80.20 Cholelithasis w/o Cholecystits K80.10 CCC.  The various methods of treatment have been discussed with the patient and family. After consideration of risks, benefits and other options for treatment, the patient has consented to  Procedure(s): XI ROBOTIC [place] (N/A) as a surgical intervention.  The patient's history has been reviewed, patient examined, no change in status, stable for surgery.  I have reviewed the patient's chart and labs.  Questions were answered to the patient's satisfaction.     Herbert Pun

## 2019-07-27 NOTE — Anesthesia Postprocedure Evaluation (Signed)
Anesthesia Post Note  Patient: Grace Flores  Procedure(s) Performed: XI ROBOTIC ASSISTED LAPAROSCOPIC CHOLECYSTECTOMY (N/A Abdomen)  Patient location during evaluation: PACU Anesthesia Type: General Level of consciousness: awake and alert Pain management: pain level controlled Vital Signs Assessment: post-procedure vital signs reviewed and stable Respiratory status: spontaneous breathing and respiratory function stable Cardiovascular status: stable Anesthetic complications: no     Last Vitals:  Vitals:   07/27/19 1026 07/27/19 1028  BP: 112/66 112/66  Pulse:  95  Resp:  (!) 23  Temp:    SpO2:  96%    Last Pain:  Vitals:   07/27/19 1028  TempSrc:   PainSc: 10-Worst pain ever                 Lige Lakeman K

## 2019-07-27 NOTE — Anesthesia Preprocedure Evaluation (Signed)
Anesthesia Evaluation  Patient identified by MRN, date of birth, ID band Patient awake    Reviewed: Allergy & Precautions, NPO status , Patient's Chart, lab work & pertinent test results  History of Anesthesia Complications Negative for: history of anesthetic complications  Airway Mallampati: II       Dental   Pulmonary neg sleep apnea, neg COPD, Not current smoker,           Cardiovascular (-) hypertension(-) Past MI and (-) CHF (-) dysrhythmias (-) Valvular Problems/Murmurs     Neuro/Psych neg Seizures    GI/Hepatic Neg liver ROS, GERD  ,  Endo/Other  diabetes, Type 2, Oral Hypoglycemic AgentsMorbid obesity  Renal/GU Renal disease (stones)     Musculoskeletal   Abdominal   Peds  Hematology   Anesthesia Other Findings   Reproductive/Obstetrics                             Anesthesia Physical Anesthesia Plan  ASA: III  Anesthesia Plan:    Post-op Pain Management:    Induction:   PONV Risk Score and Plan: 2 and Ondansetron and Midazolam  Airway Management Planned: Oral ETT  Additional Equipment:   Intra-op Plan:   Post-operative Plan:   Informed Consent: I have reviewed the patients History and Physical, chart, labs and discussed the procedure including the risks, benefits and alternatives for the proposed anesthesia with the patient or authorized representative who has indicated his/her understanding and acceptance.       Plan Discussed with:   Anesthesia Plan Comments:         Anesthesia Quick Evaluation

## 2019-07-27 NOTE — Op Note (Signed)
Preoperative diagnosis: Cholelithiasis   Postoperative diagnosis: Cholelithiasis  Procedure: Robotic Assisted Laparoscopic Cholecystectomy.   Anesthesia: GETA   Surgeon: Dr. Windell Moment  Wound Classification: Clean Contaminated  Indications: Patient is a 54 y.o. female developed right upper quadrant pain episodes and on workup was found to have cholelithiasis with a normal common duct. Robotic assisted Laparoscopic cholecystectomy was elected.  Findings: Cholelithiasis identified Critical view of safety achieved Cystic duct and artery identified, ligated and divided Adequate hemostasis  Description of procedure: The patient was placed on the operating table in the supine position. General anesthesia was induced. A time-out was completed verifying correct patient, procedure, site, positioning, and implant(s) and/or special equipment prior to beginning this procedure. An orogastric tube was placed. The abdomen was prepped and draped in the usual sterile fashion.  An incision was made in a natural skin line below the umbilicus.  The fascia was elevated and the Veress needle inserted. Proper position was confirmed by aspiration and saline meniscus test.  The abdomen was insufflated with carbon dioxide to a pressure of 15 mmHg. The patient tolerated insufflation well. A 8-mm trocar was then inserted in optiview fashion.  The laparoscope was inserted and the abdomen inspected. No injuries from initial trocar placement were noted. Additional trocars were then inserted in the following locations: an 8-mm trocar in the left lateral abdomen, and another two 8-mm trocars to the right side of the abdomen 5 cm appart. The umbilical trocar was changed to a 12 mm trocar all under direct visualization. The abdomen was inspected and no abnormalities were found. The table was placed in the reverse Trendelenburg position with the right side up. The robotic arms were docked and target anatomy identified.  Instrument inserted under direct visualization.  Filmy adhesions between the gallbladder and omentum, duodenum and transverse colon were lysed with electrocautery. The dome of the gallbladder was grasped with a prograsp and retracted over the dome of the liver. The infundibulum was also grasped with an atraumatic grasper and retracted toward the right lower quadrant. This maneuver exposed Calot's triangle. The peritoneum overlying the gallbladder infundibulum was then incised and the cystic duct and cystic artery identified and circumferentially dissected. Critical view of safety reviewed before ligating any structure. ICG green used for visualization of biliary structures. The cystic duct and cystic artery were then doubly clipped and divided close to the gallbladder.  The gallbladder was then dissected from its peritoneal attachments by electrocautery. Hemostasis was checked and the gallbladder and contained stones were removed using an endoscopic retrieval bag. The gallbladder was passed off the table as a specimen. The gallbladder fossa was copiously irrigated with saline and hemostasis was obtained. There was no evidence of bleeding from the gallbladder fossa or cystic artery or leakage of the bile from the cystic duct stump. Secondary trocars were removed under direct vision. No bleeding was noted. The robotic arms were undoked. The scope was withdrawn and the umbilical trocar removed. The abdomen was allowed to collapse. The fascia of the 5mm trocar sites was closed with figure-of-eight 0 vicryl sutures. The skin was closed with subcuticular sutures of 4-0 monocryl and topical skin adhesive. The orogastric tube was removed.  The patient tolerated the procedure well and was taken to the postanesthesia care unit in stable condition.   Specimen: Gallbladder  Complications: None  EBL: 5 mL

## 2019-07-27 NOTE — OR Nursing (Signed)
Spoke with MD Ronelle Nigh about pt receiving two doses of Norco. Per Dr. Ronelle Nigh pt continues to be stable for discharge after additional dose of pain medication (Norco).  Pt in NAD, VSS, RR even and unlabored.

## 2019-07-30 LAB — SURGICAL PATHOLOGY

## 2020-06-19 ENCOUNTER — Other Ambulatory Visit: Payer: Self-pay | Admitting: Internal Medicine

## 2020-06-19 DIAGNOSIS — Z1231 Encounter for screening mammogram for malignant neoplasm of breast: Secondary | ICD-10-CM

## 2020-07-15 LAB — EXTERNAL GENERIC LAB PROCEDURE: COLOGUARD: NEGATIVE

## 2021-04-20 ENCOUNTER — Emergency Department
Admission: EM | Admit: 2021-04-20 | Discharge: 2021-04-20 | Disposition: A | Discharge status: Home / Self Care | Payer: Commercial Managed Care - PPO | Attending: Emergency Medicine | Admitting: Emergency Medicine

## 2021-04-20 ENCOUNTER — Other Ambulatory Visit: Payer: Self-pay

## 2021-04-20 DIAGNOSIS — I82612 Acute embolism and thrombosis of superficial veins of left upper extremity: Secondary | ICD-10-CM | POA: Insufficient documentation

## 2021-04-20 DIAGNOSIS — E119 Type 2 diabetes mellitus without complications: Secondary | ICD-10-CM | POA: Insufficient documentation

## 2021-04-20 DIAGNOSIS — Z85828 Personal history of other malignant neoplasm of skin: Secondary | ICD-10-CM | POA: Insufficient documentation

## 2021-04-20 DIAGNOSIS — M79642 Pain in left hand: Secondary | ICD-10-CM | POA: Diagnosis present

## 2021-04-20 NOTE — ED Provider Notes (Signed)
[facility] Emergency Department Provider Note   ____________________________________________   None    (approximate)  I have reviewed the triage vital signs and the nursing notes.   HISTORY  Chief Complaint Hand Pain    HPI Grace Flores is a 55 y.o. female with past medical history of hyperlipidemia and diabetes who presents to the ED complaining of hand pain.  Patient reports that for the past 4 to 5 days she has noticed a small knot near the center of her left palm.  She denies any trauma to this area, does state the knot has been tender to touch.  She has not noticed any pain or swelling tracking up her left arm, denies any associated rashes.  She has not had any chest pain or shortness of breath.  She spoke with her PCP over the phone about this, was referred to the ED for further evaluation for potential DVT.  Patient denies any history of DVT or PE in the past, has not had any recent surgeries, chemotherapy, or long trips.        Past Medical History:  Diagnosis Date   Cancer ([place])    melanoma left shoulder, basal cell nose   Diabetes mellitus without complication ([place])    History of kidney stones    Hyperlipidemia    Kidney stones     Patient Active Problem List   Diagnosis Date Noted   Acute respiratory failure with hypoxia ([place]) 05/03/2019   Multifocal pneumonia 05/03/2019   Diabetes mellitus type 2 in obese ([address]) 05/03/2019   Gallstones 05/03/2019   Kidney stone 09/08/2017    Past Surgical History:  Procedure Laterality Date   ABDOMINAL HYSTERECTOMY     EXTRACORPOREAL SHOCK WAVE LITHOTRIPSY Left 09/22/2017   Procedure: EXTRACORPOREAL SHOCK WAVE LITHOTRIPSY (ESWL);  Surgeon: [name], MD;  Location: [facility] ORS;  Service: Urology;  Laterality: Left;   LAPAROSCOPIC ENDOMETRIOSIS FULGURATION      Prior to Admission medications   Medication Sig Start Date End Date Taking? Authorizing Provider  acetaminophen (TYLENOL) 325  MG tablet Take 650 mg by mouth every 6 (six) hours as needed.    [provider]  atorvastatin (LIPITOR) 10 MG tablet Take 10 mg by mouth daily.  07/18/17   [provider]  glimepiride (AMARYL) 4 MG tablet Take 4 mg by mouth 2 (two) times daily.     [provider]  pioglitazone (ACTOS) 30 MG tablet Take 30 mg by mouth daily. 04/27/19   [provider]    Allergies Tape  Family History  Family history unknown: Yes    Social History Social History   Tobacco Use   Smoking status: Never   Smokeless tobacco: Never  Vaping Use   Vaping Use: Never used  Substance Use Topics   Alcohol use: Not Currently   Drug use: No    Review of Systems  Constitutional: No fever/chills Eyes: No visual changes. ENT: No sore throat. Cardiovascular: Denies chest pain. Respiratory: Denies shortness of breath. Gastrointestinal: No abdominal pain.  No nausea, no vomiting.  No diarrhea.  No constipation. Genitourinary: Negative for dysuria. Musculoskeletal: Negative for back pain.  Positive for left hand pain and swelling. Skin: Negative for rash. Neurological: Negative for headaches, focal weakness or numbness.  ____________________________________________   PHYSICAL EXAM:  VITAL SIGNS: ED Triage Vitals  Enc Vitals Group     BP 04/20/21 1336 130/78     Pulse Rate 04/20/21 1336 64  Resp 04/20/21 1337 18     Temp 04/20/21 1337 98 F (36.7 C)     Temp src --      SpO2 04/20/21 1336 97 %     Weight --      Height --      Head Circumference --      Peak Flow --      Pain Score 04/20/21 1338 4     Pain Loc --      Pain Edu? --      Excl. in [place]? --     Constitutional: Alert and oriented. Eyes: Conjunctivae are normal. Head: Atraumatic. Nose: No congestion/rhinnorhea. Mouth/Throat: Mucous membranes are moist. Neck: Normal ROM Cardiovascular: Normal rate, regular rhythm. Grossly normal heart sounds.  2+ radial pulses bilaterally. Respiratory:  Normal respiratory effort.  No retractions. Lungs CTAB. Gastrointestinal: Soft and nontender. No distention. Genitourinary: deferred Musculoskeletal: No lower extremity tenderness nor edema.  Small nodule to the center of left palm with no associated drainage, erythema, warmth, or fluctuance.  No edema or erythema in proximal left upper extremity. Neurologic:  Normal speech and language. No gross focal neurologic deficits are appreciated. Skin:  Skin is warm, dry and intact. No rash noted. Psychiatric: Mood and affect are normal. Speech and behavior are normal.  ____________________________________________   LABS (all labs ordered are listed, but only abnormal results are displayed)  Labs Reviewed - No data to display   PROCEDURES  Procedure(s) performed (including Critical Care):  Procedures   ____________________________________________   INITIAL IMPRESSION / ASSESSMENT AND PLAN / ED COURSE      55 year old female with past medical history of hyperlipidemia and diabetes who presents to the ED complaining of 4 to 5 days of small painful nodule over her left palm.  She denies any recent trauma to this area, range of motion is intact and I doubt acute musculoskeletal injury.  She is neurovascularly intact to her left upper extremity.  She does have a small nodule that is tender to palpation, appears consistent with potential superficial venous thrombosis but there are no signs to suggest deep venous thrombosis.  Patient was counseled to take NSAIDs and apply heat as needed.  She was counseled to follow-up with her PCP and to return to the ED for new worsening symptoms, patient agrees with plan.      ____________________________________________   FINAL CLINICAL IMPRESSION(S) / ED DIAGNOSES  Final diagnoses:  Left hand pain  Superficial venous thrombosis of left upper extremity     ED Discharge Orders     None        Note:  This document was prepared using Dragon  voice recognition software and may include unintentional dictation errors.    Blake Divine, MD 04/20/21 1415

## 2021-04-20 NOTE — ED Triage Notes (Signed)
Pt comes with c/o hand pain. Pt states knot noted to left inner palm. Pt states her doc thinks it might be a clot.

## 2021-09-29 ENCOUNTER — Other Ambulatory Visit: Payer: Self-pay | Admitting: Sports Medicine

## 2021-09-29 DIAGNOSIS — G8929 Other chronic pain: Secondary | ICD-10-CM

## 2021-10-16 ENCOUNTER — Ambulatory Visit
Admission: RE | Admit: 2021-10-16 | Discharge: 2021-10-16 | Disposition: A | Discharge status: Home / Self Care | Payer: Commercial Managed Care - PPO | Source: Ambulatory Visit | Attending: Sports Medicine | Admitting: Sports Medicine

## 2021-10-16 DIAGNOSIS — G8929 Other chronic pain: Secondary | ICD-10-CM

## 2022-04-20 ENCOUNTER — Other Ambulatory Visit: Payer: Self-pay | Admitting: Internal Medicine

## 2022-04-20 ENCOUNTER — Other Ambulatory Visit (HOSPITAL_COMMUNITY): Payer: Self-pay | Admitting: Internal Medicine

## 2022-04-20 DIAGNOSIS — M7989 Other specified soft tissue disorders: Secondary | ICD-10-CM

## 2022-04-20 DIAGNOSIS — Z0000 Encounter for general adult medical examination without abnormal findings: Secondary | ICD-10-CM

## 2022-11-19 ENCOUNTER — Ambulatory Visit
Admission: RE | Admit: 2022-11-19 | Discharge: 2022-11-19 | Disposition: A | Discharge status: Home / Self Care | Payer: Commercial Managed Care - PPO | Source: Ambulatory Visit | Attending: Internal Medicine | Admitting: Internal Medicine

## 2022-11-19 DIAGNOSIS — Z0000 Encounter for general adult medical examination without abnormal findings: Secondary | ICD-10-CM | POA: Insufficient documentation

## 2022-11-19 DIAGNOSIS — M7989 Other specified soft tissue disorders: Secondary | ICD-10-CM | POA: Diagnosis present

## 2023-08-03 ENCOUNTER — Encounter: Payer: Commercial Managed Care - PPO | Attending: Internal Medicine | Admitting: Dietician

## 2023-08-03 ENCOUNTER — Encounter: Payer: Self-pay | Admitting: Dietician

## 2023-08-03 VITALS — Ht 66.0 in | Wt 390.0 lb

## 2023-08-03 DIAGNOSIS — R000 Tachycardia, unspecified: Secondary | ICD-10-CM | POA: Diagnosis not present

## 2023-08-03 DIAGNOSIS — E118 Type 2 diabetes mellitus with unspecified complications: Secondary | ICD-10-CM | POA: Diagnosis present

## 2023-08-03 DIAGNOSIS — E1169 Type 2 diabetes mellitus with other specified complication: Secondary | ICD-10-CM

## 2023-08-03 DIAGNOSIS — Z713 Dietary counseling and surveillance: Secondary | ICD-10-CM | POA: Diagnosis not present

## 2023-08-03 DIAGNOSIS — Z6841 Body Mass Index (BMI) 40.0 and over, adult: Secondary | ICD-10-CM | POA: Diagnosis not present

## 2023-08-03 NOTE — Progress Notes (Signed)
 Medical Nutrition Therapy: Visit start time: 1550  end time: 1655  Assessment:   Referral Diagnosis: Type 2 DM, obesity Other medical history/ diagnoses: hyperlipidemia, history of kidney stones Psychosocial issues/ stress concerns: none  Medications, supplements: reconciled list in medical record   Current weight: 390lbs (at urgent care 2 weeks ago per patient) Height: 5'6 BMI: 62.95 Patient's personal weight goal: 160lbs  Progress and evaluation:  Patient reports some previous efforts at weight loss -- did weight watchers about 23yrs ago, lost about 25lbs.  Took medication for weight loss about 40yrs ago and lost about 50lbs, tried again recently but had reaction feeling like throat was closing, also tingling in feet and hands.  Does not check BGs at home, has not been advised to do so. Avoids sugar sweetened beverages Food allergies: none known Works long hours from home, often busy, so meals can be delayed or missed. She is trying to make lunch break a priority. Special diet practices: none Patient seeks help with weight loss to help with diabetes control with goal of reducing/ eliminating need for medication   Dietary Intake:  Usual eating pattern includes 1-2 meals and 2-3 snacks per day. Dining out frequency: 4-5 meals per week. Who plans meals/ buys groceries? self Who prepares meals? self  Breakfast: 11am 2pcs toast with butter, jam, diet soda; sometimes skips Snack: same as pm Lunch: sometimes skips due to work; usually leftovers; sandwich Snack: 2/4 pita chips and pimiento cheese; tries to limit sweets esp donuts Supper: 6-8pm -- cooks 3-4 nights -- baked chicken pasta salad, asparagus, out other nights ie chick fila grilled chicken sandwich, fries, lemonade Snack: none or same as pm Beverages: unsweetened tea; water; occ diet soda, coffee only rarely  Physical activity: none currently; job is sedentary   Intervention:   Nutrition Care Education:   Basic  nutrition: basic food groups; appropriate nutrient balance; appropriate meal and snack schedule; general nutrition guidelines    Weight control: determining reasonable weight loss goal/rate; importance of low sugar and low fat choices; portion control; estimated energy needs for weight loss at 1600 kcal, provided guidance for 45% CHO, 25% pro, and 30% fat; use of food journal/ food tracker;effects of physical activity, sleep duration and quality, stress Advanced nutrition: cooking techniques -- pre-prepping lunch, perhaps breakfast to increase convenience during work hours Diabetes: appropriate meal and snack schedule; appropriate carb intake and balance, healthy carb choices; role of fiber, protein, fat; physical activity; effects of stress   Other intervention notes: Patient has begun working on positive dietary changes, and is motivated to continue. Established goals for additional changes with direction from patient.  Patient declined scheduling follow up but will schedule later depending on coverage of visit.   Nutritional Diagnosis:  [place]-2.1 Inpaired nutrition utilization and [place]-2.2 Altered nutrition-related laboratory As related to type 2 diabetes.  As evidenced by elevated HbA1C. [place]-3.3 Overweight/obesity As related to history of inadequate physical activity, excess calories, perhaps stress or sleep disturbance.  As evidenced by patient with current BMI of 62.   Education Materials given:  Designer, Industrial/product with food lists, sample meal pattern Sample menus Visit summary with goals/ instructions to be viewed via patient portal   Learner/ who was taught:  Patient   Level of understanding: Verbalizes/ demonstrates competency  Demonstrated degree of understanding via:   Teach back Learning barriers: None  Willingness to learn/ readiness for change: Eager, change in progress  Monitoring and Evaluation:  Dietary intake, exercise, and body weight  follow up: prn

## 2023-08-03 NOTE — Patient Instructions (Signed)
 Include a lunch daily; try preparing lunch prior to the workday so it is ready to take/ heat and eat.  Control food portions, especially starches (keep to 1 cup or fist size or less). Limit the amount of added fats.  Consider keeping a food journal to increase awareness of food portions and calorie intake. Grace Flores can help evaluate.  Work on increasing daily movement/ steps.

## 2023-08-18 ENCOUNTER — Emergency Department
Admission: EM | Admit: 2023-08-18 | Discharge: 2023-08-19 | Disposition: A | Discharge status: Home / Self Care | Payer: Commercial Managed Care - PPO | Attending: Emergency Medicine | Admitting: Emergency Medicine

## 2023-08-18 ENCOUNTER — Encounter: Payer: Self-pay | Admitting: *Deleted

## 2023-08-18 ENCOUNTER — Other Ambulatory Visit: Payer: Self-pay

## 2023-08-18 DIAGNOSIS — T7840XA Allergy, unspecified, initial encounter: Secondary | ICD-10-CM | POA: Diagnosis present

## 2023-08-18 DIAGNOSIS — T782XXA Anaphylactic shock, unspecified, initial encounter: Secondary | ICD-10-CM

## 2023-08-18 DIAGNOSIS — E118 Type 2 diabetes mellitus with unspecified complications: Secondary | ICD-10-CM | POA: Diagnosis not present

## 2023-08-18 LAB — BASIC METABOLIC PANEL
Anion gap: 9 (ref 5–15)
BUN: 19 mg/dL (ref 6–20)
CO2: 24 mmol/L (ref 22–32)
Calcium: 9.3 mg/dL (ref 8.9–10.3)
Chloride: 106 mmol/L (ref 98–111)
Creatinine, Ser: 1.01 mg/dL — ABNORMAL HIGH (ref 0.44–1.00)
GFR, Estimated: >60 mL/min (ref >60)
Glucose, Bld: 176 mg/dL — ABNORMAL HIGH (ref 70–99)
Potassium: 4 mmol/L (ref 3.5–5.1)
Sodium: 139 mmol/L (ref 135–145)

## 2023-08-18 LAB — CBC
HCT: 44.8 % (ref 36.0–46.0)
Hemoglobin: 14.7 g/dL (ref 12.0–15.0)
MCH: 29.4 pg (ref 26.0–34.0)
MCHC: 32.8 g/dL (ref 30.0–36.0)
MCV: 89.6 fL (ref 80.0–100.0)
Platelets: 223 10*3/uL (ref 150–400)
RBC: 5 MIL/uL (ref 3.87–5.11)
RDW: 13.9 % (ref 11.5–15.5)
WBC: 7.6 10*3/uL (ref 4.0–10.5)
nRBC: 0 % (ref 0.0–0.2)

## 2023-08-18 LAB — TROPONIN I (HIGH SENSITIVITY): Troponin I (High Sensitivity): 4 ng/L (ref <18)

## 2023-08-18 MED ORDER — EPINEPHRINE 0.3 MG/0.3ML IJ SOAJ
0.3000 mg | Freq: Once | INTRAMUSCULAR | Status: AC
  Administered 2023-08-18: 0.3 mg via INTRAMUSCULAR
  Filled 2023-08-18: qty 0.3

## 2023-08-18 MED ORDER — EPINEPHRINE 0.3 MG/0.3ML IJ SOAJ: 0.3000 mg | INTRAMUSCULAR | 0 refills | Status: AC | PRN

## 2023-08-18 MED ORDER — DEXAMETHASONE SODIUM PHOSPHATE 10 MG/ML IJ SOLN
10.0000 mg | Freq: Once | INTRAMUSCULAR | Status: AC
  Administered 2023-08-18: 10 mg via INTRAVENOUS
  Filled 2023-08-18: qty 1

## 2023-08-18 MED ORDER — FAMOTIDINE IN NACL 20-0.9 MG/50ML-% IV SOLN
20.0000 mg | Freq: Once | INTRAVENOUS | Status: AC
  Administered 2023-08-18: 20 mg via INTRAVENOUS
  Filled 2023-08-18: qty 50

## 2023-08-18 MED ORDER — ACETAMINOPHEN 500 MG PO TABS
1000.0000 mg | ORAL_TABLET | Freq: Once | ORAL | Status: AC
Start: 2023-08-18 — End: 2023-08-19
  Administered 2023-08-19: 1000 mg via ORAL
  Filled 2023-08-18: qty 2

## 2023-08-18 MED ORDER — DIPHENHYDRAMINE HCL 50 MG/ML IJ SOLN
25.0000 mg | Freq: Once | INTRAMUSCULAR | Status: AC
  Administered 2023-08-18: 25 mg via INTRAVENOUS
  Filled 2023-08-18: qty 1

## 2023-08-18 NOTE — ED Provider Notes (Signed)
[facility] Provider Note    Event Date/Time   First MD Initiated Contact with Patient 08/18/23 2147     (approximate)   History   Allergic Reaction   HPI  Grace Flores is a 58 y.o. female presents to the emergency department with concern for allergic reaction.  States that she took Topamax and approximately 40 minutes later felt like her throat was closing, itching and she needed to clear her throat.  Felt like her tongue and her lips were tingling.  States that she had what she thought was an allergic reaction to Topamax when she took it in November and took 2 doses and then discontinued it.  States that she tried it again tonight because she thought maybe she was just sick the initial time.  States that she was prescribed for possible weight loss.  Denies any itching, nausea or vomiting.  No other new medications.  No over-the-counter medications.     Physical Exam   Triage Vital Signs: ED Triage Vitals  Encounter Vitals Group     BP 08/18/23 2146 (!) 85/51     Systolic BP Percentile --      Diastolic BP Percentile --      Pulse Rate 08/18/23 2146 100     Resp 08/18/23 2146 (!) 22     Temp 08/18/23 2146 98 F (36.7 C)     Temp Source 08/18/23 2146 Oral     SpO2 08/18/23 2146 100 %     Weight 08/18/23 2144 (!) 390 lb (176.9 kg)     Height 08/18/23 2144 5\' 6"  (1.676 m)     Head Circumference --      Peak Flow --      Pain Score 08/18/23 2144 0     Pain Loc --      Pain Education --      Exclude from Growth Chart --     Most recent vital signs: Vitals:   08/18/23 2153 08/18/23 2200  BP: (!) 189/114 (!) 157/91  Pulse: (!) 103 88  Resp: (!) 25 18  Temp:    SpO2: 98% 98%    Physical Exam Constitutional:      Appearance: She is well-developed.  HENT:     Head: Atraumatic.  Eyes:     Conjunctiva/sclera: Conjunctivae normal.     Pupils: Pupils are equal, round, and reactive to light.  Neck:     Comments: Clearing her throat,  speaking in full sentences.  No stridor. Cardiovascular:     Rate and Rhythm: Regular rhythm.  Pulmonary:     Effort: No respiratory distress.  Abdominal:     General: There is no distension.     Tenderness: There is no abdominal tenderness.  Musculoskeletal:        General: Normal range of motion.     Cervical back: Normal range of motion.  Skin:    General: Skin is warm.  Neurological:     Mental Status: She is alert. Mental status is at baseline.     IMPRESSION / MDM / ASSESSMENT AND PLAN / ED COURSE  I reviewed the triage vital signs and the nursing notes.  Differential diagnosis including allergic reaction, ACS, dysrhythmia  EKG  I, Corena Herter, the attending physician, personally viewed and interpreted this ECG.   Rate: Normal  Rhythm: Normal sinus  Axis: Normal  Intervals: Normal  ST&T Change: None  No tachycardic or bradycardic dysrhythmias while on cardiac telemetry. LABS (all labs  ordered are listed, but only abnormal results are displayed) Labs interpreted as -    Labs Reviewed  BASIC METABOLIC PANEL - Abnormal; Notable for the following components:      Result Value   Glucose, Bld 176 (*)    Creatinine, Ser 1.01 (*)    All other components within normal limits  CBC  TROPONIN I (HIGH SENSITIVITY)     MDM  Given concern for anaphylactic reaction patient was given epinephrine, Decadron, Benadryl and Pepcid.  On reevaluation states she is feeling much better.  Will continue to monitor for recurrence of allergic reaction.  Discussed checking her glucose frequently.  Discussed close follow-up with primary care physician and no longer taking Topamax.  Given return precautions for any worsening symptoms.     PROCEDURES:  Critical Care performed: yes  .Critical Care  Performed by: Corena Herter, MD Authorized by: Corena Herter, MD   Critical care provider statement:    Critical care time (minutes):  30   Critical care time was exclusive of:   Separately billable procedures and treating other patients   Critical care was necessary to treat or prevent imminent or life-threatening deterioration of the following conditions: Anaphylactic reaction.   Critical care was time spent personally by me on the following activities:  Development of treatment plan with patient or surrogate, discussions with consultants, evaluation of patient's response to treatment, examination of patient, ordering and review of laboratory studies, ordering and review of radiographic studies, ordering and performing treatments and interventions, pulse oximetry, re-evaluation of patient's condition and review of old charts   Patient's presentation is most consistent with acute presentation with potential threat to life or bodily function.   MEDICATIONS ORDERED IN ED: Medications  acetaminophen (TYLENOL) tablet 1,000 mg (has no administration in time range)  diphenhydrAMINE (BENADRYL) injection 25 mg (25 mg Intravenous Given 08/18/23 2208)  EPINEPHrine (EPI-PEN) injection 0.3 mg (0.3 mg Intramuscular Given 08/18/23 2208)  dexamethasone (DECADRON) injection 10 mg (10 mg Intravenous Given 08/18/23 2208)  famotidine (PEPCID) IVPB 20 mg premix (0 mg Intravenous Stopped 08/18/23 2226)    FINAL CLINICAL IMPRESSION(S) / ED DIAGNOSES   Final diagnoses:  Allergic reaction, initial encounter  Anaphylaxis, initial encounter     Rx / DC Orders   ED Discharge Orders          Ordered    EPINEPHrine 0.3 mg/0.3 mL IJ SOAJ injection  As needed        08/18/23 2340             Note:  This document was prepared using Dragon voice recognition software and may include unintentional dictation errors.   Corena Herter, MD 08/18/23 2340

## 2023-08-18 NOTE — Discharge Instructions (Addendum)
You were seen in the emergency department with concerns for an allergic reaction.  Do not take Topamax and follow-up closely with your primary care physician.  You were given a dose of steroids in the emergency department so it is important that you check your glucose frequently as this can increase your glucose levels.  Return to the emergency department for any worsening symptoms.  Thank you for choosing [place] for your health care, it was my pleasure to care for you today!  Corena Herter, MD

## 2023-08-18 NOTE — ED Triage Notes (Signed)
Pt to triage via wheelchair.  Pt took 50 mg topamax at 2000.  Now pt reports throat is closing up, face flushed.  Pt report numbness of lips with tingling.  Pt alert.

## 2023-08-19 NOTE — ED Provider Notes (Signed)
Patient feels well, vital signs stable, reassessed and no signs of recurrence of allergy reaction, eager to be discharged home at this time.    Pilar Jarvis, MD 08/19/23 [phone]

## 2023-11-05 LAB — COLOGUARD: COLOGUARD: NEGATIVE

## 2023-11-05 LAB — EXTERNAL GENERIC LAB PROCEDURE: COLOGUARD: NEGATIVE

## 2024-03-13 NOTE — Progress Notes (Signed)
 Subjective:     Patient ID: Grace Flores is a 58 y.o. female.  Leg Pain   : PRESENTS WITH REDNESS TO ANTERIOR LEFT LOWER LEG. REPORTS SHE MISSED A STEP AND FELL 10 DAYS AGO. HAD ABRASION AND LACERATION TO HER KNEE AREA. MOST HAS SCABBED OVER. NOTED REDNESS AND INCREASED WARMTH THE PAST FEW DAYS. SHE IS CONCERNED FOR INFECTION. NO FEVER. HAS BEEN USING H2O2 ON WOUND ALONG WITH NEOSPORIN. CANNOT RECALL LAST TET BOOSTER. RECORDS REVIEWED.   Past Medical History:  has a past medical history of Cervicalgia, Eczema, unspecified, History of endometriosis, History of kidney stones, History of onychomycosis, Hyperlipidemia, Melanoma (CMS/HHS-HCC), Melanoma in situ of shoulder (CMS/HHS-HCC) (03/2011), Metatarsalgia of left foot, Migraine, Obesity, Psoriasis, Psoriasis, and Seasonal allergies. Past Surgical History:  has a past surgical history that includes Laparoscopic resection of endometriosis; Removal of melanoma (03/2011); TAH and BSO; Nerve injection to foot; Wisdom teeth removed; and Cholecystectomy (07/27/2019). Family History: family history includes Asthma in her mother; Breast cancer in her maternal aunt; Colon cancer in an other family member; Diabetes type II in her father and mother; High blood pressure (Hypertension) in her mother; Myocardial Infarction (Heart attack) in her mother; Stroke in an other family member. Social History:  reports that she has never smoked. She has never used smokeless tobacco. She reports that she does not drink alcohol and does not use drugs. Prior to encounter Medications:  Current Outpatient Medications on File Prior to Visit  Medication Sig Dispense Refill  . atorvastatin  (LIPITOR) 10 MG tablet TAKE 1 TABLET ONCE DAILY 90 tablet 1  . glimepiride (AMARYL) 4 MG tablet TAKE 1 TABLET TWICE A DAY 180 tablet 1  . pioglitazone (ACTOS) 30 MG tablet TAKE 1 TABLET ONCE DAILY 90 tablet 1  . albuterol MDI, PROVENTIL, VENTOLIN, PROAIR, HFA 90 mcg/actuation inhaler Inhale  2 inhalations into the lungs every 6 (six) hours as needed (Patient not taking: Reported on 03/13/2024) 8 g 0  . benzonatate (TESSALON) 200 MG capsule Take 1 capsule (200 mg total) by mouth 3 (three) times daily as needed for Cough (Patient not taking: Reported on 03/13/2024) 30 capsule 0   No current facility-administered medications on file prior to visit.   Allergies: is allergic to adhesive tape-silicones, topiramate, and percocet [oxycodone -acetaminophen ].  This an established patient is here today for: office visit.  Review of Systems     Objective:   Physical Exam Vitals and nursing note reviewed.  Constitutional:      General: She is not in acute distress.    Appearance: Normal appearance. She is not toxic-appearing.  Cardiovascular:     Rate and Rhythm: Normal rate.  Pulmonary:     Effort: Pulmonary effort is normal. No respiratory distress.  Musculoskeletal:     Comments: EVAL OF LEFT KNEE AREA REVEALS MOSTLY SCABBED ABRASION WITH ONE AREA OF MOR OPEN WITH SOME DRAINAGE. ANTERIOR LOWER LEG BELOW ABRASIONS SHOWS ERYTHEMA AND SOME INCREASED WARMTH. TENDERE TO TOUCH. SKIN DOES BLANCH.   Neurological:     Mental Status: She is alert.        Assessment:     1. Cellulitis of leg, left   -     mupirocin  (BACTROBAN ) 2 % ointment; Apply topically 3 (three) times daily for 7 days -     doxycycline (VIBRA-TABS) 100 MG tablet; Take 1 tablet (100 mg total) by mouth 2 (two) times daily for 10 days  2. Open wound, lower leg, left, initial encounter   -  Tdap (>=40YR) vaccine (Adacel/Boostrix)(No Medicare Patients)     Plan:     Patient Instructions  ELEVATED LEFT FOOT ABOVE WAIST AS MUCH AS POSSIBLE. AT LEAST 15 MIN/HR.  APPLY HEAT TO RED AREA THREE TIMES DAILY X 15 MIN APPLY OINTMENT AS DIRECTED ORAL ANTIBIOTIC AS DIRECTED MONITOR SYMPTOMS.  TO ER IF FEVERED  OTHERWISE FOLLOW UP WITH YOUR PCP AS NEEDED.

## 2024-04-03 ENCOUNTER — Other Ambulatory Visit: Payer: Self-pay

## 2024-04-03 ENCOUNTER — Emergency Department

## 2024-04-03 ENCOUNTER — Emergency Department
Admission: EM | Admit: 2024-04-03 | Discharge: 2024-04-03 | Disposition: A | Discharge status: Home / Self Care | Attending: Emergency Medicine | Admitting: Emergency Medicine

## 2024-04-03 DIAGNOSIS — L03311 Cellulitis of abdominal wall: Secondary | ICD-10-CM | POA: Diagnosis not present

## 2024-04-03 DIAGNOSIS — E119 Type 2 diabetes mellitus without complications: Secondary | ICD-10-CM | POA: Insufficient documentation

## 2024-04-03 DIAGNOSIS — R1031 Right lower quadrant pain: Secondary | ICD-10-CM | POA: Diagnosis present

## 2024-04-03 LAB — COMPREHENSIVE METABOLIC PANEL WITH GFR
ALT: 15 U/L (ref 0–44)
AST: 20 U/L (ref 15–41)
Albumin: 3.9 g/dL (ref 3.5–5.0)
Alkaline Phosphatase: 59 U/L (ref 38–126)
Anion gap: 8 (ref 5–15)
BUN: 15 mg/dL (ref 6–20)
CO2: 26 mmol/L (ref 22–32)
Calcium: 9 mg/dL (ref 8.9–10.3)
Chloride: 108 mmol/L (ref 98–111)
Creatinine, Ser: 0.91 mg/dL (ref 0.44–1.00)
GFR, Estimated: >60 mL/min (ref >60)
Glucose, Bld: 120 mg/dL — ABNORMAL HIGH (ref 70–99)
Potassium: 3.7 mmol/L (ref 3.5–5.1)
Sodium: 142 mmol/L (ref 135–145)
Total Bilirubin: 0.9 mg/dL (ref 0.0–1.2)
Total Protein: 7 g/dL (ref 6.5–8.1)

## 2024-04-03 LAB — CBC WITH DIFFERENTIAL/PLATELET
Abs Immature Granulocytes: 0.01 K/uL (ref 0.00–0.07)
Basophils Absolute: 0 K/uL (ref 0.0–0.1)
Basophils Relative: 1 %
Eosinophils Absolute: 0.2 K/uL (ref 0.0–0.5)
Eosinophils Relative: 3 %
HCT: 40.3 % (ref 36.0–46.0)
Hemoglobin: 13 g/dL (ref 12.0–15.0)
Immature Granulocytes: 0 %
Lymphocytes Relative: 35 %
Lymphs Abs: 2.2 K/uL (ref 0.7–4.0)
MCH: 29.1 pg (ref 26.0–34.0)
MCHC: 32.3 g/dL (ref 30.0–36.0)
MCV: 90.4 fL (ref 80.0–100.0)
Monocytes Absolute: 0.7 K/uL (ref 0.1–1.0)
Monocytes Relative: 11 %
Neutro Abs: 3.1 K/uL (ref 1.7–7.7)
Neutrophils Relative %: 50 %
Platelets: 176 K/uL (ref 150–400)
RBC: 4.46 MIL/uL (ref 3.87–5.11)
RDW: 13.9 % (ref 11.5–15.5)
WBC: 6.1 K/uL (ref 4.0–10.5)
nRBC: 0 % (ref 0.0–0.2)

## 2024-04-03 MED ORDER — CEPHALEXIN 500 MG PO CAPS: 500.0000 mg | ORAL_CAPSULE | Freq: Four times a day (QID) | ORAL | 0 refills | Status: AC

## 2024-04-03 MED ORDER — SULFAMETHOXAZOLE-TRIMETHOPRIM 800-160 MG PO TABS
1.0000 | ORAL_TABLET | Freq: Once | ORAL | Status: AC
  Administered 2024-04-03: 1 via ORAL
  Filled 2024-04-03: qty 1

## 2024-04-03 MED ORDER — CEPHALEXIN 500 MG PO CAPS
500.0000 mg | ORAL_CAPSULE | Freq: Once | ORAL | Status: AC
  Administered 2024-04-03: 500 mg via ORAL
  Filled 2024-04-03: qty 1

## 2024-04-03 MED ORDER — IOHEXOL 350 MG/ML SOLN
125.0000 mL | Freq: Once | INTRAVENOUS | Status: AC | PRN
  Administered 2024-04-03: 125 mL via INTRAVENOUS

## 2024-04-03 MED ORDER — SULFAMETHOXAZOLE-TRIMETHOPRIM 800-160 MG PO TABS: 1.0000 | ORAL_TABLET | Freq: Two times a day (BID) | ORAL | 0 refills | Status: AC

## 2024-04-03 NOTE — ED Triage Notes (Signed)
 Pt reports noticing an area on her abd that began to drain clear fluid yesterday, area is very small and no redness or swelling noted around area. 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

## 2024-04-03 NOTE — ED Notes (Signed)
 Pt has a small wound on the right lower side of her abdomen that is draining clear fluid. No heat, redness, tenderness observed. Pt states area is soaking a dressing in an hour, states the area has been there for 2 days with no improvement. Pt denies any know injury.

## 2024-04-03 NOTE — ED Notes (Signed)
 Pt placed in hospital gown & socks. Pt's abdominal wound drained onto bed, pt using an absorbant bed pad to help absorb the drainage. Pt refused a dressing being applied to area as she states they won't stay.

## 2024-04-03 NOTE — ED Provider Notes (Cosign Needed Addendum)
 [facility] Provider Note    Event Date/Time   First MD Initiated Contact with Patient 04/03/24 2033     (approximate)   History   Abrasion   HPI  Grace Flores is a 58 y.o. female with PMH of diabetes, kidney stones, hyperlipidemia and obesity who presents for evaluation of a draining wound on her right lower abdomen.  Patient states that this began yesterday.  She reports that the fluid has been clear and does not have a smell.  There is no redness or swelling.  She has not had any pain.  She does report a considerable amount of drainage stating that she is soaking through wound dressings within the hour getting her shirt and pants wet.      Physical Exam   Triage Vital Signs: ED Triage Vitals  Encounter Vitals Group     BP 04/03/24 1932 (!) 152/68     Girls Systolic BP Percentile --      Girls Diastolic BP Percentile --      Boys Systolic BP Percentile --      Boys Diastolic BP Percentile --      Pulse Rate 04/03/24 1932 84     Resp 04/03/24 1932 18     Temp 04/03/24 1932 98.1 F (36.7 C)     Temp Source 04/03/24 1932 Oral     SpO2 04/03/24 1932 98 %     Weight 04/03/24 1933 (!) 400 lb (181.4 kg)     Height 04/03/24 1933 5' 6 (1.676 m)     Head Circumference --      Peak Flow --      Pain Score 04/03/24 1932 0     Pain Loc --      Pain Education --      Exclude from Growth Chart --     Most recent vital signs: Vitals:   04/03/24 1932  BP: (!) 152/68  Pulse: 84  Resp: 18  Temp: 98.1 F (36.7 C)  SpO2: 98%   General: Awake, no distress.  CV:  Good peripheral perfusion.  Resp:  Normal effort.  Abd:  No distention.  Other:  Small opening less than 1 cm in diameter to the right lower abdomen on the pannus with clear drainage but no surrounding erythema, warmth or tenderness to palpation   ED Results / Procedures / Treatments   Labs (all labs ordered are listed, but only abnormal results are displayed) Labs Reviewed   COMPREHENSIVE METABOLIC PANEL WITH GFR - Abnormal; Notable for the following components:      Result Value   Glucose, Bld 120 (*)    All other components within normal limits  CBC WITH DIFFERENTIAL/PLATELET    RADIOLOGY  CT abdomen pelvis obtained, I interpreted the images as well as reviewed the radiologist report which showed changes consistent with cellulitis in the inferior panniculus without a definitive abscess seen.  PROCEDURES:  Critical Care performed: No  Procedures   MEDICATIONS ORDERED IN ED: Medications  cephALEXin (KEFLEX) capsule 500 mg (has no administration in time range)  sulfamethoxazole -trimethoprim  (BACTRIM  DS) 800-160 MG per tablet 1 tablet (has no administration in time range)  iohexol  (OMNIPAQUE ) 350 MG/ML injection 125 mL (125 mLs Intravenous Contrast Given 04/03/24 2230)     IMPRESSION / MDM / ASSESSMENT AND PLAN / ED COURSE  I reviewed the triage vital signs and the nursing notes.  58 year old female presents for evaluation of a draining wound to her abdomen.  Blood pressure is elevated otherwise vital signs are stable.  Patient NAD but anxious on exam.  Differential diagnosis includes, but is not limited to, abscess, cellulitis, seroma, fistula.  Patient's presentation is most consistent with acute complicated illness / injury requiring diagnostic workup.  I am not sure what is causing the drainage.  Does not appear infected as there is no erythema, warmth or pain.  The fluid is clear does not appear purulent or bloody.  Will obtain basic labs and CT scan to rule out fistula.  Clinical Course as of 04/03/24 2322  Tue Apr 03, 2024  2215 CBC with Differential Unremarkable. [LD]  2239 Comprehensive metabolic panel(!) Blood sugar elevated, otherwise normal. [LD]  2301 CT ABDOMEN PELVIS W CONTRAST Scan shows changes consistent with cellulitis in the inferior panniculus without an abscess seen.  Will start patient on oral  antibiotics and advised her to follow-up with her primary care provider for a wound check in a few days. [LD]  2313 Patient updated on plan, she voiced understanding, all questions were answered and she is stable at discharge. [LD]  2322 Patient does have history of MRSA so we will give double coverage with Keflex and Bactrim . [LD]    Clinical Course User Index [LD] Cleaster Tinnie LABOR, PA-C     FINAL CLINICAL IMPRESSION(S) / ED DIAGNOSES   Final diagnoses:  Cellulitis of abdominal wall     Rx / DC Orders   ED Discharge Orders          Ordered    cephALEXin (KEFLEX) 500 MG capsule  4 times daily        04/03/24 2312    sulfamethoxazole -trimethoprim  (BACTRIM  DS) 800-160 MG tablet  2 times daily        04/03/24 2321             Note:  This document was prepared using Dragon voice recognition software and may include unintentional dictation errors.   Cleaster Tinnie LABOR, PA-C 04/03/24 2313    Cleaster Tinnie LABOR, PA-C 04/03/24 2322    Waymond Lorelle Cummins, MD 04/04/24 [phone]

## 2024-04-03 NOTE — Discharge Instructions (Signed)
 Your blood work was normal today.  The CT scan showed cellulitis without an abscess.  This is treated with oral antibiotics.  You were given your first dose while in the emergency department, please pick the prescription up tomorrow morning.  I would advise you to keep your appointment with your primary care provider as they can do a wound check to make sure you are improving.  Return to the emergency department with any worsening symptoms like development of a fever, pain, redness or warmth spreading.

## 2024-04-03 NOTE — ED Notes (Signed)
 Pt has swelling to left leg that she states she sees her PCP about. Left leg is significantly larger than right. Pitting edema observed. EDP at bedside.

## 2024-04-03 NOTE — ED Notes (Signed)
 Patient transported to CT
